# Patient Record
Sex: Male | Born: 1990 | Race: Black or African American | Hispanic: No | Marital: Single | Smoking: Former smoker
Health system: Southern US, Community
[De-identification: ages and names within clinical notes are randomized; demographics above are authoritative.]

## PROBLEM LIST (undated history)

## (undated) DIAGNOSIS — J45909 Unspecified asthma, uncomplicated: Secondary | ICD-10-CM

## (undated) DIAGNOSIS — I10 Essential (primary) hypertension: Secondary | ICD-10-CM

## (undated) HISTORY — PX: TONSILLECTOMY: SUR1361

---
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
## 2011-02-07 ENCOUNTER — Emergency Department (HOSPITAL_COMMUNITY): Payer: Self-pay

## 2011-02-07 ENCOUNTER — Emergency Department (HOSPITAL_COMMUNITY)
Admission: EM | Admit: 2011-02-07 | Discharge: 2011-02-08 | Disposition: A | Discharge status: Home / Self Care | Payer: Self-pay | Attending: Emergency Medicine | Admitting: Emergency Medicine

## 2011-02-07 DIAGNOSIS — M25569 Pain in unspecified knee: Secondary | ICD-10-CM | POA: Insufficient documentation

## 2013-06-08 ENCOUNTER — Emergency Department (HOSPITAL_COMMUNITY)
Admission: EM | Admit: 2013-06-08 | Discharge: 2013-06-08 | Disposition: A | Discharge status: Home / Self Care | Payer: Self-pay | Attending: Emergency Medicine | Admitting: Emergency Medicine

## 2013-06-08 ENCOUNTER — Encounter (HOSPITAL_COMMUNITY): Payer: Self-pay | Admitting: Emergency Medicine

## 2013-06-08 DIAGNOSIS — R300 Dysuria: Secondary | ICD-10-CM | POA: Insufficient documentation

## 2013-06-08 DIAGNOSIS — R369 Urethral discharge, unspecified: Secondary | ICD-10-CM | POA: Insufficient documentation

## 2013-06-08 DIAGNOSIS — F17200 Nicotine dependence, unspecified, uncomplicated: Secondary | ICD-10-CM | POA: Insufficient documentation

## 2013-06-08 LAB — URINALYSIS, ROUTINE W REFLEX MICROSCOPIC
Bilirubin Urine: NEGATIVE
Specific Gravity, Urine: 1.014 (ref 1.005–1.030)
pH: 7 (ref 5.0–8.0)

## 2013-06-08 LAB — URINE MICROSCOPIC-ADD ON

## 2013-06-08 MED ORDER — ONDANSETRON 4 MG PO TBDP
4.0000 mg | ORAL_TABLET | Freq: Once | ORAL | Status: AC
  Administered 2013-06-08: 4 mg via ORAL
  Filled 2013-06-08: qty 1

## 2013-06-08 MED ORDER — AZITHROMYCIN 1 G PO PACK
1.0000 g | PACK | Freq: Once | ORAL | Status: AC
  Administered 2013-06-08: 1 g via ORAL
  Filled 2013-06-08: qty 1

## 2013-06-08 MED ORDER — CEFTRIAXONE SODIUM 250 MG IJ SOLR
250.0000 mg | Freq: Once | INTRAMUSCULAR | Status: AC
  Administered 2013-06-08: 250 mg via INTRAMUSCULAR
  Filled 2013-06-08: qty 250

## 2013-06-08 NOTE — Progress Notes (Signed)
[facility] provided patient with a [facility] Orange Card application.  °

## 2013-06-08 NOTE — ED Notes (Signed)
Pt left prior to discharge paperwork was given, PA Abby aware.

## 2013-06-08 NOTE — ED Provider Notes (Signed)
CSN: [id number]     Arrival date & time 06/08/13  7829 History   First MD Initiated Contact with Patient 06/08/13 1109     Chief Complaint  Patient presents with  . Penile Discharge   (Consider location/radiation/quality/duration/timing/severity/associated sxs/prior Treatment) HPI 22 year old male presents emergency department with 2 days of penile discharge and dysuria.  Patient admits  to unprotected sexual intercourse with innumerable multiple sexual partners.  He is unable to recall any partners with similar symptoms.  The patient denies any penile lesions, testicular pain, follow her up when necessary.  The patient denies any lesions around the anus.  He denies fevers, chills, myalgias, arthralgias, abdominal pain, nausea, vomiting, chest pain, shortness of breath, difficulty swallowing, neck stiffness, rashes or headache.  History reviewed. No pertinent past medical history. History reviewed. No pertinent past surgical history. No family history on file. History  Substance Use Topics  . Smoking status: Current Every Day Smoker -- 1.00 packs/day    Types: Cigarettes  . Smokeless tobacco: Not on file  . Alcohol Use: No    Review of Systems As stated in history of present illness Allergies  Review of patient's allergies indicates no known allergies.  Home Medications   Current Outpatient Rx  Name  Route  Sig  Dispense  Refill  . ibuprofen (ADVIL,MOTRIN) 200 MG tablet   Oral   Take 400 mg by mouth every 6 (six) hours as needed for pain (headache).          BP 138/104  Pulse 69  Temp(Src) 99.5 F (37.5 C) (Oral)  Resp 18  SpO2 100% Physical Exam  Nursing note and vitals reviewed. Constitutional: He appears well-developed and well-nourished. No distress.  HENT:  Head: Normocephalic and atraumatic.  Eyes: Conjunctivae are normal. No scleral icterus.  Neck: Normal range of motion. Neck supple.  Cardiovascular: Normal rate, regular rhythm and normal heart sounds.    Pulmonary/Chest: Effort normal and breath sounds normal. No respiratory distress.  Abdominal: Soft. There is no tenderness.  Genitourinary:  Normal circumcised penis.  No testicular pain.  Cremasteric reflex is present.  No inguinal lymphadenopathy.  No lesions noted.  Patient has been the discharge from the meatus of the penis.  GC Chlamydia probe obtained  Musculoskeletal: He exhibits no edema.  Neurological: He is alert.  Skin: Skin is warm and dry. He is not diaphoretic.  Psychiatric: His behavior is normal.    ED Course  Procedures (including critical care time) Labs Review Labs Reviewed  GC/CHLAMYDIA PROBE AMP  URINALYSIS, ROUTINE W REFLEX MICROSCOPIC   Imaging Review No results found.  MDM   1. Penile discharge   2. Dysuria    Patient with symptoms consistent with gonorrhea or chlamydial infection.  He has been treated here with azithromycin and Rocephin.  I discussed need to protect himself when having sexual intercourse and need for further testing at the health Department as well as contacting his sexual partners about his STD. The patient is here with his aunt he needs to be.  He did not wish to wait for urinalysis.  I have allowed the patient to leave before the results have resulted.  Patient has given me his contact and fell and will contact the patient if there is abnormal result.The patient appears reasonably screened and/or stabilized for discharge and I doubt any other medical condition or other [facility] requiring further screening, evaluation, or treatment in the ED at this time prior to discharge.     Arthor Captain, PA-C 06/08/13  1212 

## 2013-06-08 NOTE — ED Notes (Signed)
Pt states that he has been having penile discharge x 2 days that is "tan" in color.  States that he has a "bunch" of sexual partners and didn't use protection with a couple.

## 2013-06-08 NOTE — Discharge Instructions (Signed)
You have been treated in the emergency department for an infection, possibly sexually transmitted. Results of your gonorrhea and chlamydia tests are pending and you will be notified if they are positive. It is very important to practice safe sex and use condoms when sexually active. If your results are positive you need to notify all sexual partners so they can be treated as well. The website http://www.dontspreadit.com/ can be used to send anonymous text messages or emails to alert sexual contacts. Follow up with your doctor, or OBGYN in regards to today's visit.   ° °Gonorrhea and Chlamydia °SYMPTOMS  °In females, symptoms may go unnoticed. Symptoms that are more noticeable can include:  °Belly (abdominal) pain.  °Painful intercourse.  °Watery mucous-like discharge from the vagina.  °Miscarriage.  °Discomfort when urinating.  °Inflammation of the rectum.  °Abnormal gray-green frothy vaginal discharge  °Vaginal itching and irritatio  °Itching and irritation of the area outside the vagina.   °Painful urination.  °Bleeding after sexual intercourse.  °In males, symptoms include:  °Burning with urination.  °Pain in the testicles.  °Watery mucous-like discharge from the penis.  °It can cause longstanding (chronic) pelvic pain after frequent infections.  °TREATMENT  °PID can cause women to not be able to have children (sterile) if left untreated or if half-treated.  It is important to finish ALL medications given to you.  °This is a sexually transmitted infection. So you are also at risk for other sexually transmitted diseases, including HIV (AIDS), it is recommended that you get tested. °HOME CARE INSTRUCTIONS  °Warning: This infection is contagious. Do not have sex until treatment is completed. Follow up at your caregiver's office or the clinic to which you were referred. If your diagnosis (learning what is wrong) is confirmed by culture or some other method, your recent sexual contacts need treatment. Even if they are  symptom free or have a negative culture or evaluation, they should be treated.  °PREVENTION  °Women should use sanitary pads instead of tampons for vaginal discharge.  °Wipe front to back after using the toilet and avoid douching.   °Practice safe sex, use condoms, have only one sex partner and be sure your sex partner is not having sex with others.  °Ask your caregiver to test you for chlamydia at your regular checkups or sooner if you are having symptoms.  °Ask for further information if you are pregnant.  °SEEK IMMEDIATE MEDICAL CARE IF:  °You develop an oral temperature above 102° F (38.9° C), not controlled by medications or lasting more than 2 days.  °You develop an increase in pain.  °You develop any type of abnormal discharge.  °You develop vaginal bleeding and it is not time for your period.  °You develop painful intercourse.  ° °Bacterial Vaginosis  °Bacterial vaginosis (BV) is a vaginal infection where the normal balance of bacteria in the vagina is disrupted. This is not a sexually transmitted disease and your sexual partners do NOT need to be treated. °CAUSES  °The cause of BV is not fully understood. BV develops when there is an increase or imbalance of harmful bacteria.  °Some activities or behaviors can upset the normal balance of bacteria in the vagina and put women at increased risk including:  °Having a new sex partner or multiple sex partners.  °Douching.  °Using an intrauterine device (IUD) for contraception.  °It is not clear what role sexual activity plays in the development of BV. However, women that have never had sexual intercourse are rarely   infected with BV.  °Women do not get BV from toilet seats, bedding, swimming pools or from touching objects around them.  ° °SYMPTOMS  °Grey vaginal discharge.  °A fish-like odor with discharge, especially after sexual intercourse.  °Itching or burning of the vagina and vulva.  °Burning or pain with urination.  °Some women have no signs or symptoms at  all.  ° °TREATMENT  °Sometimes BV will clear up without treatment.  °BV may be treated with antibiotics.  °BV can recur after treatment. If this happens, a second round of antibiotics will often be prescribed.  °HOME CARE INSTRUCTIONS  °Finish all medication as directed by your caregiver.  °Do not have sex until treatment is completed.  °Do NOT drink any alcoholic beverages while being treated  with Metronidazole (Flagyl). This will cause a severe reaction inducing vomiting. ° °RESOURCE GUIDE ° °Dental Problems ° °Patients with Medicaid: °[facility]                     [facility] °[address]                                           [address] °Phone:  [phone]                                                  Phone:  510-2600 ° °If unable to pay or uninsured, contact:  [facility] or [facility] to become qualified for the adult dental clinic. ° °Chronic Pain Problems °Contact [facility]  297-2271 °Patients need to be referred by their primary care doctor. ° °Insufficient Money for Medicine °Contact [employer]:  call "211" or [facility] 271-5999. ° °No Primary Care Doctor °Call [facility]  832-8000 °Other agencies that provide inexpensive medical care °   [facility]  832-8035 °   [facility]  832-7272 °   [facility]  271-5999 °   [facility]  832-4777 °   [facility]  373-0678 °   [facility]  272-1050 ° °Psychological Services °[facility]  [phone] °[facility]  378-7881 °[facility]   800 853-5163 (emergency services 641-4993) ° °Substance Abuse Resources °[facility]  336-882-2125 °[facility] 336-784-9470 °[facility] 336-285-9073 °[facility] 336-845-3988 °Residential & Outpatient Substance Abuse Program  800-659-3381 ° °Abuse/Neglect °[place] [facility] (336)  641-3795 °[place] [facility] Hotline 800-378-5315 (After Hours) ° °Emergency Shelter °[facility] (336) 271-5985 ° °Maternity Homes °[facility] (336) 275-9566 °[facility] (704) 372-4663 ° °MRSA Hotline #:   832-7006 ° ° ° °[place] [facility] ° °[facility] [place] [facility]     [employer]                          [facility] °[address] [place]                       [address]      [address]  °  [place]                                                [place]                            [place] °Phone:  349-3220                                   Phone:  [phone]                 Phone:  342-8140 ° °[facility] °Phone:  342-8316 ° °[place] [facility] Hotline °(336) 342-1394 °(336) 342-3537 (After Hours) ° °

## 2013-06-09 LAB — URINE CULTURE: Colony Count: NO GROWTH

## 2013-06-09 LAB — GC/CHLAMYDIA PROBE AMP: GC Probe RNA: POSITIVE — AB

## 2013-06-09 NOTE — ED Provider Notes (Signed)
Medical screening examination/treatment/procedure(s) were performed by non-physician practitioner and as supervising physician I was immediately available for consultation/collaboration.   Shanna Cisco, MD 06/09/13 [phone]

## 2013-06-10 ENCOUNTER — Telehealth (HOSPITAL_COMMUNITY): Payer: Self-pay | Admitting: *Deleted

## 2013-06-10 NOTE — ED Notes (Signed)
+   Gonorrhea + Chlamydia  Patient treated with Rocephin And Zithromax-[facility] faxed 

## 2013-06-12 ENCOUNTER — Telehealth (HOSPITAL_COMMUNITY): Payer: Self-pay | Admitting: Emergency Medicine

## 2013-06-14 NOTE — ED Notes (Signed)
Unable to contact via phone - letter sent °

## 2013-08-20 ENCOUNTER — Encounter (HOSPITAL_COMMUNITY): Payer: Self-pay | Admitting: Emergency Medicine

## 2013-08-20 ENCOUNTER — Emergency Department (HOSPITAL_COMMUNITY)
Admission: EM | Admit: 2013-08-20 | Discharge: 2013-08-21 | Disposition: A | Discharge status: Home / Self Care | Payer: Self-pay | Attending: Emergency Medicine | Admitting: Emergency Medicine

## 2013-08-20 ENCOUNTER — Emergency Department (HOSPITAL_COMMUNITY): Payer: Self-pay

## 2013-08-20 DIAGNOSIS — Y9302 Activity, running: Secondary | ICD-10-CM | POA: Insufficient documentation

## 2013-08-20 DIAGNOSIS — Z79899 Other long term (current) drug therapy: Secondary | ICD-10-CM | POA: Insufficient documentation

## 2013-08-20 DIAGNOSIS — R296 Repeated falls: Secondary | ICD-10-CM | POA: Insufficient documentation

## 2013-08-20 DIAGNOSIS — Y929 Unspecified place or not applicable: Secondary | ICD-10-CM | POA: Insufficient documentation

## 2013-08-20 DIAGNOSIS — S8001XA Contusion of right knee, initial encounter: Secondary | ICD-10-CM

## 2013-08-20 DIAGNOSIS — S8000XA Contusion of unspecified knee, initial encounter: Secondary | ICD-10-CM | POA: Insufficient documentation

## 2013-08-20 DIAGNOSIS — F17200 Nicotine dependence, unspecified, uncomplicated: Secondary | ICD-10-CM | POA: Insufficient documentation

## 2013-08-20 MED ORDER — IBUPROFEN 200 MG PO TABS
600.0000 mg | ORAL_TABLET | Freq: Once | ORAL | Status: AC
  Administered 2013-08-20: 600 mg via ORAL
  Filled 2013-08-20: qty 3

## 2013-08-20 MED ORDER — IBUPROFEN 600 MG PO TABS: 600.0000 mg | ORAL_TABLET | Freq: Three times a day (TID) | ORAL | Status: DC

## 2013-08-20 NOTE — ED Notes (Signed)
Pt arrived to the ED with a complaint of knee pain.  Pt was running and fell hurting his right knee.

## 2013-08-20 NOTE — ED Provider Notes (Signed)
CSN: [id number]     Arrival date & time 08/20/13  2144 History   None  This chart was scribed for Earley Favor NP, a non-physician practitioner working with Gavin Pound. Oletta Lamas, MD by Lewanda Rife, ED Scribe. This patient was seen in room WTR6/WTR6 and the patient's care was started at 11:44 PM     Chief Complaint  Patient presents with  . Knee Pain   (Consider location/radiation/quality/duration/timing/severity/associated sxs/prior Treatment) The history is provided by the patient. No language interpreter was used.   HPI Comments: Eric Valencia is a 22 y.o. male who presents to the Emergency Department complaining of constant moderate right knee pain onset PTA since falling onto his right knee while running away from the police. Reports pain is exacerbated by touch and weight bearing. Denies any alleviating factors. Denies associated head injury, neck pain, LOC, and other injuries.   History reviewed. No pertinent past medical history. History reviewed. No pertinent past surgical history. History reviewed. No pertinent family history. History  Substance Use Topics  . Smoking status: Current Every Day Smoker -- 1.00 packs/day    Types: Cigarettes  . Smokeless tobacco: Not on file  . Alcohol Use: No    Review of Systems  Musculoskeletal: Positive for arthralgias.  All other systems reviewed and are negative.   A complete 10 system review of systems was obtained and all systems are negative except as noted in the HPI and PMHx.    Allergies  Review of patient's allergies indicates no known allergies.  Home Medications   Current Outpatient Rx  Name  Route  Sig  Dispense  Refill  . albuterol (PROVENTIL HFA;VENTOLIN HFA) 108 (90 BASE) MCG/ACT inhaler   Inhalation   Inhale 1 puff into the lungs every 6 (six) hours as needed for wheezing or shortness of breath.         Marland Kitchen ibuprofen (ADVIL,MOTRIN) 600 MG tablet   Oral   Take 1 tablet (600 mg total) by mouth 3 (three)  times daily.   30 tablet   0    BP 105/63  Pulse 107  Temp(Src) 98.2 F (36.8 C) (Oral)  Resp 20  Ht 5\' 6"  (1.676 m)  Wt 168 lb (76.204 kg)  BMI 27.13 kg/m2  SpO2 98% Physical Exam  Nursing note and vitals reviewed. Constitutional: He is oriented to person, place, and time. He appears well-developed and well-nourished. No distress.  HENT:  Head: Normocephalic and atraumatic.  Eyes: EOM are normal.  Neck: Neck supple. No tracheal deviation present.  Cardiovascular: Normal rate.   Pulmonary/Chest: Effort normal. No respiratory distress.  Musculoskeletal: Normal range of motion.       Right knee: He exhibits no swelling. Tenderness found.  Pt gestures to distal patellar tendon. Soft tissue swelling laterally.    Neurological: He is alert and oriented to person, place, and time.  Skin: Skin is warm and dry.  Psychiatric: He has a normal mood and affect. His behavior is normal.    ED Course  Procedures  COORDINATION OF CARE:  Nursing notes reviewed. Vital signs reviewed. Initial pt interview and examination performed.   11:44 PM-Discussed work up plan with pt at bedside, which includes x-ray of right knee. Pt agrees with plan.   Treatment plan initiated: Medications  ibuprofen (ADVIL,MOTRIN) tablet 600 mg (600 mg Oral Given 08/20/13 2312)     Initial diagnostic testing ordered.    Labs Review Labs Reviewed - No data to display Imaging Review Dg Knee Complete 4 Views  Right  08/20/2013   CLINICAL DATA:  Fall. Pain along the anterior aspect a right knee.  EXAM: RIGHT KNEE - COMPLETE 4+ VIEW  COMPARISON:  None.  FINDINGS: There is no evidence of fracture, dislocation, or joint effusion. There is no evidence of arthropathy or other focal bone abnormality. Soft tissues are unremarkable.  IMPRESSION: Negative.   Electronically Signed   By: Signa Kell M.D.   On: 08/20/2013 23:21    EKG Interpretation   None       MDM   1. Knee contusion, right, initial encounter      Xray reviewed no fx  placed in knee sleeve for comfort and additional support  I personally performed the services described in this documentation, which was scribed in my presence. The recorded information has been reviewed and is accurate.   Arman Filter, NP 08/20/13 [phone]

## 2013-08-20 NOTE — ED Notes (Signed)
Pt ambulatory to exam room with steady gait.  

## 2013-08-20 NOTE — ED Notes (Signed)
Patient transported to X-ray 

## 2013-08-21 NOTE — ED Provider Notes (Signed)
Medical screening examination/treatment/procedure(s) were performed by non-physician practitioner and as supervising physician I was immediately available for consultation/collaboration.  EKG Interpretation   None         Gavin Pound. Oletta Lamas, MD 08/21/13 1610

## 2013-09-26 ENCOUNTER — Ambulatory Visit (HOSPITAL_COMMUNITY): Payer: Self-pay | Attending: Emergency Medicine

## 2013-09-26 ENCOUNTER — Emergency Department (HOSPITAL_COMMUNITY)
Admission: EM | Admit: 2013-09-26 | Discharge: 2013-09-26 | Disposition: A | Discharge status: Home / Self Care | Payer: Self-pay | Attending: Emergency Medicine | Admitting: Emergency Medicine

## 2013-09-26 ENCOUNTER — Encounter (HOSPITAL_COMMUNITY): Payer: Self-pay | Admitting: Emergency Medicine

## 2013-09-26 DIAGNOSIS — R109 Unspecified abdominal pain: Secondary | ICD-10-CM

## 2013-09-26 DIAGNOSIS — F17200 Nicotine dependence, unspecified, uncomplicated: Secondary | ICD-10-CM | POA: Insufficient documentation

## 2013-09-26 DIAGNOSIS — M545 Low back pain, unspecified: Secondary | ICD-10-CM | POA: Insufficient documentation

## 2013-09-26 DIAGNOSIS — Z79899 Other long term (current) drug therapy: Secondary | ICD-10-CM | POA: Insufficient documentation

## 2013-09-26 DIAGNOSIS — R1013 Epigastric pain: Secondary | ICD-10-CM | POA: Insufficient documentation

## 2013-09-26 LAB — CBC WITH DIFFERENTIAL/PLATELET
Eosinophils Absolute: 0.1 10*3/uL (ref 0.0–0.7)
Eosinophils Relative: 1 % (ref 0–5)
Hemoglobin: 14.7 g/dL (ref 13.0–17.0)
Lymphocytes Relative: 19 % (ref 12–46)
Lymphs Abs: 1.5 10*3/uL (ref 0.7–4.0)
MCH: 29.9 pg (ref 26.0–34.0)
MCV: 88.8 fL (ref 78.0–100.0)
Monocytes Relative: 8 % (ref 3–12)
Neutrophils Relative %: 72 % (ref 43–77)
RBC: 4.91 MIL/uL (ref 4.22–5.81)

## 2013-09-26 LAB — COMPREHENSIVE METABOLIC PANEL
Alkaline Phosphatase: 62 U/L (ref 39–117)
BUN: 11 mg/dL (ref 6–23)
CO2: 28 mEq/L (ref 19–32)
GFR calc Af Amer: >90 mL/min (ref >90)
GFR calc non Af Amer: >90 mL/min (ref >90)
Glucose, Bld: 70 mg/dL (ref 70–99)
Potassium: 4.5 mEq/L (ref 3.5–5.1)
Total Bilirubin: 0.4 mg/dL (ref 0.3–1.2)
Total Protein: 7 g/dL (ref 6.0–8.3)

## 2013-09-26 LAB — URINALYSIS, ROUTINE W REFLEX MICROSCOPIC
Bilirubin Urine: NEGATIVE
Specific Gravity, Urine: 1.005 (ref 1.005–1.030)
pH: 6 (ref 5.0–8.0)

## 2013-09-26 LAB — URINE MICROSCOPIC-ADD ON

## 2013-09-26 MED ORDER — IOHEXOL 300 MG/ML  SOLN
100.0000 mL | Freq: Once | INTRAMUSCULAR | Status: AC | PRN
  Administered 2013-09-26: 100 mL via INTRAVENOUS

## 2013-09-26 MED ORDER — MORPHINE SULFATE 4 MG/ML IJ SOLN
4.0000 mg | Freq: Once | INTRAMUSCULAR | Status: AC
  Administered 2013-09-26: 4 mg via INTRAVENOUS
  Filled 2013-09-26: qty 1

## 2013-09-26 MED ORDER — SODIUM CHLORIDE 0.9 % IV SOLN
1000.0000 mL | INTRAVENOUS | Status: DC
  Administered 2013-09-26: 1000 mL via INTRAVENOUS

## 2013-09-26 MED ORDER — ONDANSETRON HCL 4 MG/2ML IJ SOLN
4.0000 mg | Freq: Once | INTRAMUSCULAR | Status: AC
  Administered 2013-09-26: 4 mg via INTRAVENOUS
  Filled 2013-09-26: qty 2

## 2013-09-26 MED ORDER — IOHEXOL 300 MG/ML  SOLN
50.0000 mL | Freq: Once | INTRAMUSCULAR | Status: AC | PRN
  Administered 2013-09-26: 50 mL via ORAL

## 2013-09-26 MED ORDER — IBUPROFEN 800 MG PO TABS: 800.0000 mg | ORAL_TABLET | Freq: Three times a day (TID) | ORAL | Status: DC

## 2013-09-26 MED ORDER — SODIUM CHLORIDE 0.9 % IV SOLN
1000.0000 mL | Freq: Once | INTRAVENOUS | Status: AC
  Administered 2013-09-26: 1000 mL via INTRAVENOUS

## 2013-09-26 NOTE — ED Notes (Signed)
Pt c/o upper abdominal pain and lower back pain x "2 hrs."  Pain score 9/10.  Denies n/v/d and GU symptoms.

## 2013-09-26 NOTE — Progress Notes (Signed)
[facility] provided pt with a list of primary care resources and [facility] information.  °

## 2013-09-26 NOTE — ED Provider Notes (Signed)
CSN: [id number]     Arrival date & time 09/26/13  1206 History   First MD Initiated Contact with Patient 09/26/13 1224     Chief Complaint  Patient presents with  . Abdominal Pain  . Back Pain   (Consider location/radiation/quality/duration/timing/severity/associated sxs/prior Treatment) HPI Patient reports about 3 hours ago he was awake and laying in bed and started getting a sharp pain that he describes as being in his epigastric area that radiates into his back and then goes up his upper back between his shoulder blades. He states the pain is constant. He states deep breathing and walking makes the pain worse. He states he has to bend over to walk. He denies nausea, vomiting, diarrhea, or cough. He does not feel short of breath but states it hurts to breathe. He states he last ate last night. He has not been around anybody else is ill. He denies having this before. He states the only new medication he took was in ibuprofen last night for a toothache.  PCP none  History reviewed. No pertinent past medical history.  RAD  History reviewed. No pertinent past surgical history. History reviewed. No pertinent family history. History  Substance Use Topics  . Smoking status: Current Every Day Smoker -- 1.00 packs/day    Types: Cigarettes  . Smokeless tobacco: Never Used  . Alcohol Use: Yes  employed  Review of Systems  All other systems reviewed and are negative.    Allergies  Review of patient's allergies indicates no known allergies.  Home Medications   Current Outpatient Rx  Name  Route  Sig  Dispense  Refill  . albuterol (PROVENTIL HFA;VENTOLIN HFA) 108 (90 BASE) MCG/ACT inhaler   Inhalation   Inhale 1 puff into the lungs every 6 (six) hours as needed for wheezing or shortness of breath.         Marland Kitchen ibuprofen (ADVIL,MOTRIN) 600 MG tablet   Oral   Take 1 tablet (600 mg total) by mouth 3 (three) times daily.   30 tablet   0    BP 150/87  Pulse 89  Temp(Src) 97.9 F  (36.6 C) (Oral)  Resp 16  SpO2 99%  Vital signs normal   Physical Exam  Nursing note and vitals reviewed. Constitutional: He is oriented to person, place, and time. He appears well-developed and well-nourished.  Non-toxic appearance. He does not appear ill. No distress.  HENT:  Head: Normocephalic and atraumatic.  Right Ear: External ear normal.  Left Ear: External ear normal.  Nose: Nose normal. No mucosal edema or rhinorrhea.  Mouth/Throat: Oropharynx is clear and moist and mucous membranes are normal. No dental abscesses or uvula swelling.  Eyes: Conjunctivae and EOM are normal. Pupils are equal, round, and reactive to light.  Neck: Normal range of motion and full passive range of motion without pain. Neck supple.  Cardiovascular: Normal rate, regular rhythm and normal heart sounds.  Exam reveals no gallop and no friction rub.   No murmur heard. Pulmonary/Chest: Effort normal and breath sounds normal. No respiratory distress. He has no wheezes. He has no rhonchi. He has no rales. He exhibits no tenderness and no crepitus.  Abdominal: Soft. Normal appearance and bowel sounds are normal. He exhibits no distension. There is no tenderness. There is no rebound and no guarding.    Although patient told me his pain was in his upper abdomen on exam he is most tender in the right lower corner with some mild tenderness in the right mid and  right upper abdomen. He has pain to percussion over the right lower quadrant. He has mild Rovsing sign. He has pain to knee tapping and he has positive psoas sign.  Musculoskeletal: Normal range of motion. He exhibits no edema and no tenderness.  Moves all extremities well.   Neurological: He is alert and oriented to person, place, and time. He has normal strength. No cranial nerve deficit.  Skin: Skin is warm, dry and intact. No rash noted. No erythema. No pallor.  Psychiatric: He has a normal mood and affect. His speech is normal and behavior is normal. His  mood appears not anxious.    ED Course  Procedures (including critical care time)  Medications  0.9 %  sodium chloride infusion (0 mLs Intravenous Stopped 09/26/13 1454)  morphine 4 MG/ML injection 4 mg (4 mg Intravenous Given 09/26/13 1342)  ondansetron (ZOFRAN) injection 4 mg (4 mg Intravenous Given 09/26/13 1342)  iohexol (OMNIPAQUE) 300 MG/ML solution 50 mL (50 mLs Oral Contrast Given 09/26/13 1335)  iohexol (OMNIPAQUE) 300 MG/ML solution 100 mL (100 mLs Intravenous Contrast Given 09/26/13 1539)  morphine 4 MG/ML injection 4 mg (4 mg Intravenous Given 09/26/13 1627)    Pt requesting more pain medication at 16:15  Pt left with Dr Gwendolyn Grant at 16:20 at change of shift to reexamine and get UA results.    Labs Review Results for orders placed during the hospital encounter of 09/26/13  URINALYSIS, ROUTINE W REFLEX MICROSCOPIC      Result Value Range   Color, Urine YELLOW  YELLOW   APPearance CLOUDY (*) CLEAR   Specific Gravity, Urine 1.005  1.005 - 1.030   pH 6.0  5.0 - 8.0   Glucose, UA NEGATIVE  NEGATIVE mg/dL   Hgb urine dipstick TRACE (*) NEGATIVE   Bilirubin Urine NEGATIVE  NEGATIVE   Ketones, ur NEGATIVE  NEGATIVE mg/dL   Protein, ur NEGATIVE  NEGATIVE mg/dL   Urobilinogen, UA 0.2  0.0 - 1.0 mg/dL   Nitrite NEGATIVE  NEGATIVE   Leukocytes, UA NEGATIVE  NEGATIVE  CBC WITH DIFFERENTIAL      Result Value Range   WBC 7.8  4.0 - 10.5 K/uL   RBC 4.91  4.22 - 5.81 MIL/uL   Hemoglobin 14.7  13.0 - 17.0 g/dL   HCT 09.8  11.9 - 14.7 %   MCV 88.8  78.0 - 100.0 fL   MCH 29.9  26.0 - 34.0 pg   MCHC 33.7  30.0 - 36.0 g/dL   RDW 82.9  56.2 - 13.0 %   Platelets 259  150 - 400 K/uL   Neutrophils Relative % 72  43 - 77 %   Neutro Abs 5.6  1.7 - 7.7 K/uL   Lymphocytes Relative 19  12 - 46 %   Lymphs Abs 1.5  0.7 - 4.0 K/uL   Monocytes Relative 8  3 - 12 %   Monocytes Absolute 0.6  0.1 - 1.0 K/uL   Eosinophils Relative 1  0 - 5 %   Eosinophils Absolute 0.1  0.0 - 0.7 K/uL    Basophils Relative 0  0 - 1 %   Basophils Absolute 0.0  0.0 - 0.1 K/uL  COMPREHENSIVE METABOLIC PANEL      Result Value Range   Sodium 140  135 - 145 mEq/L   Potassium 4.5  3.5 - 5.1 mEq/L   Chloride 102  96 - 112 mEq/L   CO2 28  19 - 32 mEq/L   Glucose, Bld 70  70 - 99 mg/dL   BUN 11  6 - 23 mg/dL   Creatinine, Ser 1.61  0.50 - 1.35 mg/dL   Calcium 9.7  8.4 - 09.6 mg/dL   Total Protein 7.0  6.0 - 8.3 g/dL   Albumin 4.1  3.5 - 5.2 g/dL   AST 19  0 - 37 U/L   ALT 12  0 - 53 U/L   Alkaline Phosphatase 62  39 - 117 U/L   Total Bilirubin 0.4  0.3 - 1.2 mg/dL   GFR calc non Af Amer >90  >90 mL/min   GFR calc Af Amer >90  >90 mL/min  URINE MICROSCOPIC-ADD ON      Result Value Range   WBC, UA 0-2  <3 WBC/hpf   RBC / HPF 0-2  <3 RBC/hpf   Laboratory interpretation all normal   Imaging Review Ct Abdomen Pelvis W Contrast  09/26/2013   CLINICAL DATA:  Abdominal pain  EXAM: CT ABDOMEN AND PELVIS WITH CONTRAST  TECHNIQUE: Multidetector CT imaging of the abdomen and pelvis was performed using the standard protocol following bolus administration of intravenous contrast. Oral contrast was also administered.  CONTRAST:  OMNIPAQUE IOHEXOL 300 MG/ML  SOLN  COMPARISON:  None.  FINDINGS: Lung bases are clear.  No focal liver lesions are identified. There is no biliary duct dilatation. Gallbladder wall does not appear thickened.  Spleen, pancreas, and adrenals appear normal. Kidneys bilaterally show no mass or hydronephrosis. There is no renal or ureteral calculus on either side.  In the pelvis, the urinary bladder is midline with normal wall thickness. There is no pelvic mass or fluid collection. Appendix appears normal.  There is no bowel obstruction.  No free air or portal venous air.  There is no ascites, adenopathy, or abscess in the abdomen or pelvis. Aorta is nonaneurysmal. There are no blastic or lytic bone lesions.  IMPRESSION: Appendix appears normal. There is no mesenteric inflammation or  abscess. No bowel obstruction. No hydronephrosis.   Electronically Signed   By: Bretta Bang M.D.   On: 09/26/2013 15:57    EKG Interpretation   None       MDM   1. Abdominal pain     Disposition pending  Devoria Albe, MD, Armando Gang    Ward Givens, MD 09/27/13 [phone]

## 2013-09-26 NOTE — ED Provider Notes (Signed)
1600 - Care from Dr. Lynelle Doctor. CT normal for abdominal pain. Redosed pain meds, awaiting urine. Urine negative, repeat exam benign. Patient talking on phone during my re-exam. Given motrin.   1. Abdominal pain      Dagmar Hait, MD 09/26/13 1745

## 2013-10-07 ENCOUNTER — Emergency Department (HOSPITAL_COMMUNITY)
Admission: EM | Admit: 2013-10-07 | Discharge: 2013-10-07 | Disposition: A | Discharge status: Home / Self Care | Payer: Self-pay | Attending: Emergency Medicine | Admitting: Emergency Medicine

## 2013-10-07 ENCOUNTER — Encounter (HOSPITAL_COMMUNITY): Payer: Self-pay | Admitting: Emergency Medicine

## 2013-10-07 DIAGNOSIS — K089 Disorder of teeth and supporting structures, unspecified: Secondary | ICD-10-CM | POA: Insufficient documentation

## 2013-10-07 DIAGNOSIS — F17200 Nicotine dependence, unspecified, uncomplicated: Secondary | ICD-10-CM | POA: Insufficient documentation

## 2013-10-07 DIAGNOSIS — K029 Dental caries, unspecified: Secondary | ICD-10-CM | POA: Insufficient documentation

## 2013-10-07 DIAGNOSIS — Z79899 Other long term (current) drug therapy: Secondary | ICD-10-CM | POA: Insufficient documentation

## 2013-10-07 DIAGNOSIS — Z791 Long term (current) use of non-steroidal anti-inflammatories (NSAID): Secondary | ICD-10-CM | POA: Insufficient documentation

## 2013-10-07 MED ORDER — TRAMADOL HCL 50 MG PO TABS: 50.0000 mg | ORAL_TABLET | Freq: Four times a day (QID) | ORAL | Status: DC | PRN

## 2013-10-07 MED ORDER — TRAMADOL HCL 50 MG PO TABS
50.0000 mg | ORAL_TABLET | Freq: Once | ORAL | Status: AC
  Administered 2013-10-07: 50 mg via ORAL
  Filled 2013-10-07: qty 1

## 2013-10-07 MED ORDER — PENICILLIN V POTASSIUM 500 MG PO TABS: 500.0000 mg | ORAL_TABLET | Freq: Three times a day (TID) | ORAL | Status: DC

## 2013-10-07 NOTE — ED Provider Notes (Signed)
CSN: [id number]     Arrival date & time 10/07/13  1738 History  This chart was scribed for non-physician practitioner, Fayrene Helper, PA-C,working with Junius Argyle, MD, by Karle Plumber, ED Scribe.  This patient was seen in room WTR6/WTR6 and the patient's care was started at 8:00 PM.  Chief Complaint  Patient presents with  . Dental Pain   The history is provided by the patient. No language interpreter was used.   HPI Comments:  Eric Valencia is a 22 y.o. male who presents to the Emergency Department complaining of ongoing intermittent bilateral dental pain for approximately one month. The pt states the pain has been worse today. Pt states that the upper right back teeth have holes in them. Pt states the pain is aching and a 10/10. He reports eating and talking make the pain worse. He reports taking one Ibuprofen 800mg  with no relief. He denies fever, chills, and abnormal taste in the mouth.  History reviewed. No pertinent past medical history. History reviewed. No pertinent past surgical history. History reviewed. No pertinent family history. History  Substance Use Topics  . Smoking status: Current Every Day Smoker -- 1.00 packs/day    Types: Cigarettes  . Smokeless tobacco: Never Used  . Alcohol Use: Yes    Review of Systems  Constitutional: Negative for fever and chills.  HENT: Positive for dental problem. Negative for facial swelling.     Allergies  Review of patient's allergies indicates no known allergies.  Home Medications   Current Outpatient Rx  Name  Route  Sig  Dispense  Refill  . albuterol (PROVENTIL HFA;VENTOLIN HFA) 108 (90 BASE) MCG/ACT inhaler   Inhalation   Inhale 1 puff into the lungs every 6 (six) hours as needed for wheezing or shortness of breath.         Marland Kitchen ibuprofen (ADVIL,MOTRIN) 800 MG tablet   Oral   Take 1 tablet (800 mg total) by mouth 3 (three) times daily.   21 tablet   0    Triage Vitals: BP 138/88  Pulse 64  Temp(Src) 98.3  F (36.8 C) (Oral)  Resp 14  SpO2 100% Physical Exam  Nursing note and vitals reviewed. Constitutional: He is oriented to person, place, and time. He appears well-developed and well-nourished.  HENT:  Head: Normocephalic and atraumatic.  Right Ear: External ear normal.  Left Ear: External ear normal.  Mouth/Throat: Oropharynx is clear and moist. Dental caries present. No dental abscesses. No oropharyngeal exudate.  Dental decay to second premolar on right upper jaw. Moderate size dental decay to 1st molar on left lower jaw. No gingival erythema. No obvious abscess.   No trismus  Eyes: EOM are normal.  Neck: Normal range of motion. Neck supple.  Cardiovascular: Normal rate.   Pulmonary/Chest: Effort normal.  Musculoskeletal: Normal range of motion.  Lymphadenopathy:    He has no cervical adenopathy.  Neurological: He is alert and oriented to person, place, and time.  Skin: Skin is warm and dry.  Psychiatric: He has a normal mood and affect. His behavior is normal.    ED Course  Procedures (including critical care time) DIAGNOSTIC STUDIES: Oxygen Saturation is 100% on RA, normal by my interpretation.   COORDINATION OF CARE: 8:05 PM- Will prescribe pain medication and give dental referral. Pt verbalizes understanding and agrees to plan.  Medications - No data to display  Labs Review Labs Reviewed - No data to display Imaging Review No results found.  EKG Interpretation   None  MDM   1. Pain due to dental caries    BP 138/88  Pulse 64  Temp(Src) 98.3 F (36.8 C) (Oral)  Resp 14  SpO2 100%   I personally performed the services described in this documentation, which was scribed in my presence. The recorded information has been reviewed and is accurate.     Fayrene Helper, PA-C 10/07/13 2013

## 2013-10-07 NOTE — ED Notes (Signed)
Patient in c/o toothache over the last few days

## 2013-10-08 NOTE — ED Provider Notes (Signed)
Medical screening examination/treatment/procedure(s) were performed by non-physician practitioner and as supervising physician I was immediately available for consultation/collaboration.  EKG Interpretation   None         Junius Argyle, MD 10/08/13 1304

## 2014-10-07 ENCOUNTER — Emergency Department (HOSPITAL_COMMUNITY)
Admission: EM | Admit: 2014-10-07 | Discharge: 2014-10-07 | Discharge status: Against Medical Advice | Payer: Self-pay | Attending: Emergency Medicine | Admitting: Emergency Medicine

## 2014-10-07 ENCOUNTER — Encounter (HOSPITAL_COMMUNITY): Payer: Self-pay

## 2014-10-07 ENCOUNTER — Emergency Department (HOSPITAL_COMMUNITY): Payer: Self-pay

## 2014-10-07 DIAGNOSIS — Y998 Other external cause status: Secondary | ICD-10-CM | POA: Insufficient documentation

## 2014-10-07 DIAGNOSIS — Z720 Tobacco use: Secondary | ICD-10-CM | POA: Insufficient documentation

## 2014-10-07 DIAGNOSIS — Z79899 Other long term (current) drug therapy: Secondary | ICD-10-CM | POA: Insufficient documentation

## 2014-10-07 DIAGNOSIS — W06XXXA Fall from bed, initial encounter: Secondary | ICD-10-CM | POA: Insufficient documentation

## 2014-10-07 DIAGNOSIS — Y9389 Activity, other specified: Secondary | ICD-10-CM | POA: Insufficient documentation

## 2014-10-07 DIAGNOSIS — W19XXXA Unspecified fall, initial encounter: Secondary | ICD-10-CM

## 2014-10-07 DIAGNOSIS — S3992XA Unspecified injury of lower back, initial encounter: Secondary | ICD-10-CM | POA: Insufficient documentation

## 2014-10-07 DIAGNOSIS — Y92003 Bedroom of unspecified non-institutional (private) residence as the place of occurrence of the external cause: Secondary | ICD-10-CM | POA: Insufficient documentation

## 2014-10-07 DIAGNOSIS — M545 Low back pain, unspecified: Secondary | ICD-10-CM

## 2014-10-07 MED ORDER — OXYCODONE-ACETAMINOPHEN 5-325 MG PO TABS: 1.0000 | ORAL_TABLET | Freq: Once | ORAL | Status: DC

## 2014-10-07 MED ORDER — DIAZEPAM 5 MG PO TABS: 5.0000 mg | ORAL_TABLET | Freq: Once | ORAL | Status: DC

## 2014-10-07 MED ORDER — IBUPROFEN 800 MG PO TABS: 800.0000 mg | ORAL_TABLET | Freq: Once | ORAL | Status: DC

## 2014-10-07 NOTE — ED Notes (Signed)
I am unable to locate pt. 

## 2014-10-07 NOTE — ED Notes (Signed)
Still unable to locate pt.

## 2014-10-07 NOTE — ED Notes (Signed)
He states he has low back pain.  He cites engaging in horse play ("wrestling") with his girlfriend; who is with him.  Pt. In no distress.  He c/o "my left leg is kinda numb".

## 2014-10-07 NOTE — ED Notes (Signed)
Still unable to locate pt.--will disposition AMA.

## 2014-10-07 NOTE — ED Provider Notes (Signed)
CSN: [id number]     Arrival date & time 10/07/14  [id number] History   First MD Initiated Contact with Patient 10/07/14 [phone]     Chief Complaint  Patient presents with  . Back Pain      HPI Patient reports awakening with a sore low back this morning.  He states that last night he fell onto his bottom out of the bed.  He states his been having pain in his back since this morning.  Has not tried any medications prior to arrival.  Denies weakness of his arms or legs.  No abdominal pain.  No chest pain shortness of breath.  Pain is mild to moderate in severity.  Pain is worse with movement and palpation of his low back.   History reviewed. No pertinent past medical history. No past surgical history on file. No family history on file. History  Substance Use Topics  . Smoking status: Current Every Day Smoker -- 1.00 packs/day    Types: Cigarettes  . Smokeless tobacco: Never Used  . Alcohol Use: Yes    Review of Systems  All other systems reviewed and are negative.     Allergies  Review of patient's allergies indicates no known allergies.  Home Medications   Prior to Admission medications   Medication Sig Start Date End Date Taking? Authorizing Provider  albuterol (PROVENTIL HFA;VENTOLIN HFA) 108 (90 BASE) MCG/ACT inhaler Inhale 1 puff into the lungs every 6 (six) hours as needed for wheezing or shortness of breath.   Yes Historical Provider, MD  ibuprofen (ADVIL,MOTRIN) 800 MG tablet Take 1 tablet (800 mg total) by mouth 3 (three) times daily. Patient not taking: Reported on 10/07/2014 09/26/13   Elwin MochaBlair Walden, MD  penicillin v potassium (VEETID) 500 MG tablet Take 1 tablet (500 mg total) by mouth 3 (three) times daily. Patient not taking: Reported on 10/07/2014 10/07/13   Fayrene HelperBowie Tran, PA-C  traMADol (ULTRAM) 50 MG tablet Take 1 tablet (50 mg total) by mouth every 6 (six) hours as needed. Patient not taking: Reported on 10/07/2014 10/07/13   Fayrene HelperBowie Tran, PA-C   BP 142/88 mmHg  Pulse  64  Temp(Src) 98.2 F (36.8 C) (Oral)  Resp 16  SpO2 99% Physical Exam  Constitutional: He is oriented to person, place, and time. He appears well-developed and well-nourished.  HENT:  Head: Normocephalic and atraumatic.  Eyes: EOM are normal.  Neck: Normal range of motion.  Cardiovascular: Normal rate, regular rhythm, normal heart sounds and intact distal pulses.   Pulmonary/Chest: Effort normal and breath sounds normal. No respiratory distress.  Abdominal: Soft. He exhibits no distension. There is no tenderness.  Musculoskeletal: Normal range of motion.  Mild lumbar and paralumbar tenderness without significant spasm.  5 out of 5 strength in bilateral lower extremity major muscle groups.  No thoracic tenderness  Neurological: He is alert and oriented to person, place, and time.  Skin: Skin is warm and dry.  Psychiatric: He has a normal mood and affect. Judgment normal.  Nursing note and vitals reviewed.   ED Course  Procedures (including critical care time) Labs Review Labs Reviewed - No data to display  Imaging Review Dg Lumbar Spine Complete  10/07/2014   CLINICAL DATA:  Fall out of bed 1 day ago directly onto lower back with resulting left low back pain radiating down left leg.  EXAM: LUMBAR SPINE - COMPLETE 4+ VIEW  COMPARISON:  None.  FINDINGS: There is no evidence of lumbar spine fracture. Alignment is normal. Intervertebral disc  spaces are maintained.  IMPRESSION: Negative.   Electronically Signed   By: Elberta Fortisaniel  Boyle M.D.   On: 10/07/2014 10:45  I personally reviewed the imaging tests through PACS system I reviewed available ER/hospitalization records through the EMR    EKG Interpretation None      MDM   Final diagnoses:  Low back pain  Fall    Concern for possible compression fracture given follow-up to his bottom last night.  X-ray pending.  11:23 AM Xray negative. Pt left prior to disposition. i was unable to give return precautions and reevaluate his  pain after management in the ER   Lyanne CoKevin M Janely Gullickson, MD 10/07/14 1123

## 2015-01-11 ENCOUNTER — Emergency Department (HOSPITAL_COMMUNITY)
Admission: EM | Admit: 2015-01-11 | Discharge: 2015-01-11 | Discharge status: Against Medical Advice | Payer: Self-pay | Attending: Emergency Medicine | Admitting: Emergency Medicine

## 2015-01-11 ENCOUNTER — Encounter (HOSPITAL_COMMUNITY): Payer: Self-pay | Admitting: Emergency Medicine

## 2015-01-11 ENCOUNTER — Emergency Department (HOSPITAL_COMMUNITY): Payer: Self-pay

## 2015-01-11 DIAGNOSIS — Z720 Tobacco use: Secondary | ICD-10-CM | POA: Insufficient documentation

## 2015-01-11 DIAGNOSIS — S6992XA Unspecified injury of left wrist, hand and finger(s), initial encounter: Secondary | ICD-10-CM | POA: Insufficient documentation

## 2015-01-11 DIAGNOSIS — Y9289 Other specified places as the place of occurrence of the external cause: Secondary | ICD-10-CM | POA: Insufficient documentation

## 2015-01-11 DIAGNOSIS — Y998 Other external cause status: Secondary | ICD-10-CM | POA: Insufficient documentation

## 2015-01-11 DIAGNOSIS — Y9389 Activity, other specified: Secondary | ICD-10-CM | POA: Insufficient documentation

## 2015-01-11 NOTE — ED Notes (Signed)
Pt called for room twice from triage and once from xray
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
## 2015-01-11 NOTE — ED Notes (Signed)
Pt called a third time from triage with no answer

## 2015-01-19 ENCOUNTER — Encounter (HOSPITAL_COMMUNITY): Payer: Self-pay

## 2015-01-19 ENCOUNTER — Emergency Department (HOSPITAL_COMMUNITY)
Admission: EM | Admit: 2015-01-19 | Discharge: 2015-01-19 | Disposition: A | Discharge status: Home / Self Care | Payer: Self-pay | Attending: Emergency Medicine | Admitting: Emergency Medicine

## 2015-01-19 ENCOUNTER — Emergency Department (HOSPITAL_COMMUNITY): Payer: Self-pay

## 2015-01-19 DIAGNOSIS — G4489 Other headache syndrome: Secondary | ICD-10-CM | POA: Insufficient documentation

## 2015-01-19 DIAGNOSIS — R55 Syncope and collapse: Secondary | ICD-10-CM | POA: Insufficient documentation

## 2015-01-19 DIAGNOSIS — Z79899 Other long term (current) drug therapy: Secondary | ICD-10-CM | POA: Insufficient documentation

## 2015-01-19 DIAGNOSIS — Z720 Tobacco use: Secondary | ICD-10-CM | POA: Insufficient documentation

## 2015-01-19 LAB — CBC WITH DIFFERENTIAL/PLATELET
BASOS PCT: 0 % (ref 0–1)
Basophils Absolute: 0 10*3/uL (ref 0.0–0.1)
Eosinophils Absolute: 0.1 10*3/uL (ref 0.0–0.7)
Eosinophils Relative: 2 % (ref 0–5)
HEMATOCRIT: 40.8 % (ref 39.0–52.0)
Hemoglobin: 13.5 g/dL (ref 13.0–17.0)
Lymphocytes Relative: 22 % (ref 12–46)
Lymphs Abs: 1.1 10*3/uL (ref 0.7–4.0)
MCH: 30.1 pg (ref 26.0–34.0)
MCHC: 33.1 g/dL (ref 30.0–36.0)
MCV: 90.9 fL (ref 78.0–100.0)
MONO ABS: 0.5 10*3/uL (ref 0.1–1.0)
Monocytes Relative: 11 % (ref 3–12)
NEUTROS ABS: 3.2 10*3/uL (ref 1.7–7.7)
Neutrophils Relative %: 65 % (ref 43–77)
PLATELETS: 236 10*3/uL (ref 150–400)
RBC: 4.49 MIL/uL (ref 4.22–5.81)
RDW: 14.6 % (ref 11.5–15.5)
WBC: 4.8 10*3/uL (ref 4.0–10.5)

## 2015-01-19 LAB — BASIC METABOLIC PANEL
Anion gap: 11 (ref 5–15)
BUN: 7 mg/dL (ref 6–23)
CALCIUM: 8.8 mg/dL (ref 8.4–10.5)
CHLORIDE: 104 mmol/L (ref 96–112)
CO2: 27 mmol/L (ref 19–32)
Creatinine, Ser: 0.89 mg/dL (ref 0.50–1.35)
GFR calc Af Amer: >90 mL/min (ref >90)
GFR calc non Af Amer: >90 mL/min (ref >90)
GLUCOSE: 85 mg/dL (ref 70–99)
Potassium: 3.6 mmol/L (ref 3.5–5.1)
Sodium: 142 mmol/L (ref 135–145)

## 2015-01-19 LAB — CBG MONITORING, ED: GLUCOSE-CAPILLARY: 100 mg/dL — AB (ref 70–99)

## 2015-01-19 MED ORDER — DIPHENHYDRAMINE HCL 50 MG/ML IJ SOLN
25.0000 mg | Freq: Once | INTRAMUSCULAR | Status: AC
  Administered 2015-01-19: 25 mg via INTRAVENOUS
  Filled 2015-01-19: qty 1

## 2015-01-19 MED ORDER — METOCLOPRAMIDE HCL 5 MG/ML IJ SOLN
10.0000 mg | Freq: Once | INTRAMUSCULAR | Status: AC
  Administered 2015-01-19: 10 mg via INTRAVENOUS
  Filled 2015-01-19: qty 2

## 2015-01-19 MED ORDER — SODIUM CHLORIDE 0.9 % IV BOLUS (SEPSIS)
1000.0000 mL | Freq: Once | INTRAVENOUS | Status: AC
  Administered 2015-01-19: 1000 mL via INTRAVENOUS

## 2015-01-19 NOTE — ED Notes (Signed)
Pt states he has been dizzy with headache for "a while".  Pt states today he "blacked out".  He was alone. Denies head injury.  Nausea with no vomiting

## 2015-01-19 NOTE — ED Notes (Signed)
Patient transported to CT. Will administer medications and obtain blood sample when pt returns.

## 2015-01-19 NOTE — ED Provider Notes (Signed)
Pt ambulated w/o difficulty, feeling somehwat better, would like to go. Pt will be d/c'd per D.r Wickline's instructions, i have also encouraged [facility]   1. Syncope, unspecified syncope type   2. Other headache syndrome      Toy CookeyMegan Docherty, MD 01/19/15 [id number]

## 2015-01-19 NOTE — ED Provider Notes (Signed)
CSN: [id number]     Arrival date & time 01/19/15  1351 History   First MD Initiated Contact with Patient 01/19/15 1409     Chief Complaint  Patient presents with  . Loss of Consciousness     Patient is a 24 y.o. male presenting with syncope. The history is provided by the patient.  Loss of Consciousness Episode history:  Single Most recent episode:  Today Duration: unknown. Timing:  Constant Progression:  Improving Chronicity:  New Relieved by:  Nothing Worsened by:  Nothing tried Associated symptoms: headaches   Associated symptoms: no chest pain, no difficulty breathing, no fever, no focal weakness, no shortness of breath and no weakness   Risk factors: no coronary artery disease and no seizures   Pt reports he has had HA for 3 days He reports it was gradual in onset and worsened yesterday No head trauma No Seizure He reports h/o HA but this is worse and has lasted longer  Today he was sitting on couch and thinks he passed out He was alone so unclear how long it lasted He now reports HA and blurred vision No previous h/o syncope    PMH - none Family history - negative for CVA and negative for aneurysms History  Substance Use Topics  . Smoking status: Current Every Day Smoker -- 1.00 packs/day    Types: Cigarettes  . Smokeless tobacco: Never Used  . Alcohol Use: Yes    Review of Systems  Constitutional: Negative for fever.  Respiratory: Negative for shortness of breath.   Cardiovascular: Positive for syncope. Negative for chest pain.  Neurological: Positive for syncope and headaches. Negative for focal weakness and weakness.  All other systems reviewed and are negative.     Allergies  Review of patient's allergies indicates no known allergies.  Home Medications   Prior to Admission medications   Medication Sig Start Date End Date Taking? Authorizing Provider  albuterol (PROVENTIL HFA;VENTOLIN HFA) 108 (90 BASE) MCG/ACT inhaler Inhale 1 puff into the lungs  every 6 (six) hours as needed for wheezing or shortness of breath.   Yes Historical Provider, MD   BP 150/87 mmHg  Pulse 81  Temp(Src) 98.9 F (37.2 C) (Oral)  Resp 16  SpO2 97% Physical Exam CONSTITUTIONAL: Well developed/well nourished HEAD: Normocephalic/atraumatic EYES: EOMI/PERRL, no nystagmus, no ptosis ENMT: Mucous membranes moist NECK: supple no meningeal signs, no bruits SPINE/BACK:entire spine nontender CV: S1/S2 noted, no murmurs/rubs/gallops noted LUNGS: Lungs are clear to auscultation bilaterally, no apparent distress ABDOMEN: soft, nontender, no rebound or guarding GU:no cva tenderness NEURO:Awake/alert, facies symmetric, no arm or leg drift is noted Cranial nerves 3/4/5/6/04/20/09/11/12 tested and intact Sensation to light touch intact in all extremities EXTREMITIES: pulses normal, full ROM SKIN: warm, color normal PSYCH: flat affect  ED Course  Procedures  3:05 PM Pt reported to me that he had HA for 3 days He told nursing it was for "Awhile" He reports HA gradual onset, doubt SAH CT head was ordered due reported HA with ?syncope.  He is a poor historian and offers very little detail 3:43 PM Pt with some improvement CT head negative Labs pending At time of signout to dr docherty, f/u on labs, ambulate patient and if improved he can go home  Labs Review Labs Reviewed  CBC WITH DIFFERENTIAL/PLATELET  BASIC METABOLIC PANEL    Imaging Review Ct Head Wo Contrast  01/19/2015   CLINICAL DATA:  24 year old male with loss of consciousness today. Subacute headache. Nausea without vomiting.  EXAM: CT HEAD WITHOUT CONTRAST  TECHNIQUE: Contiguous axial images were obtained from the base of the skull through the vertex without intravenous contrast.  COMPARISON:  10/24/2007 CT.  FINDINGS: No intracranial abnormalities are identified, including mass lesion or mass effect, hydrocephalus, extra-axial fluid collection, midline shift, hemorrhage, or acute infarction.  The  visualized bony calvarium is unremarkable.  IMPRESSION: Unremarkable noncontrast head CT.   Electronically Signed   By: Harmon Pier M.D.   On: 01/19/2015 14:40     EKG Interpretation   Date/Time:  Friday January 19 2015 14:01:59 EDT Ventricular Rate:  79 PR Interval:  182 QRS Duration: 97 QT Interval:  357 QTC Calculation: 409 R Axis:   89 Text Interpretation:  Sinus rhythm Probable left atrial enlargement No  previous ECGs available Confirmed by Bebe Shaggy  MD, Lamar Naef ([id number]) on  01/19/2015 2:10:38 PM     Medications  metoCLOPramide (REGLAN) injection 10 mg (10 mg Intravenous Given 01/19/15 1449)  diphenhydrAMINE (BENADRYL) injection 25 mg (25 mg Intravenous Given 01/19/15 1449)  sodium chloride 0.9 % bolus 1,000 mL (1,000 mLs Intravenous New Bag/Given 01/19/15 1448)    MDM   Final diagnoses:  Syncope, unspecified syncope type  Other headache syndrome    Nursing notes including past medical history and social history reviewed and considered in documentation Labs/vital reviewed myself and considered during evaluation     Zadie Rhine, MD 01/19/15 1544

## 2015-01-19 NOTE — ED Notes (Signed)
Patient returned from CT

## 2015-01-19 NOTE — Discharge Instructions (Signed)
Driving and Equipment Restrictions Some medical problems make it dangerous to drive, ride a bike, or use machines. Some of these problems are:  A hard blow to the head (concussion).  Passing out (fainting).  Twitching and shaking (seizures).  Low blood sugar.  Taking medicine to help you relax (sedatives).  Taking pain medicines.  Wearing an eye patch.  Wearing splints. This can make it hard to use parts of your body that you need to drive safely. HOME CARE   Do not drive until your doctor says it is okay.  Do not use machines until your doctor says it is okay. You may need a form signed by your doctor (medical release) before you can drive again. You may also need this form before you do other tasks where you need to be fully alert. MAKE SURE YOU:  Understand these instructions.  Will watch your condition.  Will get help right away if you are not doing well or get worse. Document Released: 11/06/2004 Document Revised: 12/22/2011 Document Reviewed: 02/06/2010 [facility] Patient Information 2015 [place] [place]. This information is not intended to replace advice given to you by your health care provider. Make sure you discuss any questions you have with your health care provider.   You are having a headache. No specific cause was found today for your headache. It may have been a migraine or other cause of headache. Stress, anxiety, fatigue, and depression are common triggers for headaches. Your headache today does not appear to be life-threatening or require hospitalization, but often the exact cause of headaches is not determined in the emergency department. Therefore, follow-up with your doctor is very important to find out what may have caused your headache, and whether or not you need any further diagnostic testing or treatment. Sometimes headaches can appear benign (not harmful), but then more serious symptoms can develop which should prompt an immediate re-evaluation by your  doctor or the emergency department.  SEEK MEDICAL ATTENTION IF:  You develop possible problems with medications prescribed.  The medications don't resolve your headache, if it recurs , or if you have multiple episodes of vomiting or can't take fluids. You have a change from the usual headache.  RETURN IMMEDIATELY IF you develop a sudden, severe headache or confusion, become poorly responsive or faint, develop a fever above 100.42F or problem breathing, have a change in speech, vision, swallowing, or understanding, or develop new weakness, numbness, tingling, incoordination, or have a seizure.

## 2015-11-25 ENCOUNTER — Emergency Department (HOSPITAL_COMMUNITY): Payer: No Typology Code available for payment source

## 2015-11-25 ENCOUNTER — Emergency Department (HOSPITAL_COMMUNITY)
Admission: EM | Admit: 2015-11-25 | Discharge: 2015-11-25 | Discharge status: Against Medical Advice | Payer: No Typology Code available for payment source | Attending: Emergency Medicine | Admitting: Emergency Medicine

## 2015-11-25 ENCOUNTER — Encounter (HOSPITAL_COMMUNITY): Payer: Self-pay | Admitting: Emergency Medicine

## 2015-11-25 DIAGNOSIS — S0990XA Unspecified injury of head, initial encounter: Secondary | ICD-10-CM | POA: Insufficient documentation

## 2015-11-25 DIAGNOSIS — J45901 Unspecified asthma with (acute) exacerbation: Secondary | ICD-10-CM | POA: Diagnosis not present

## 2015-11-25 DIAGNOSIS — Y92410 Unspecified street and highway as the place of occurrence of the external cause: Secondary | ICD-10-CM | POA: Insufficient documentation

## 2015-11-25 DIAGNOSIS — F17210 Nicotine dependence, cigarettes, uncomplicated: Secondary | ICD-10-CM | POA: Insufficient documentation

## 2015-11-25 DIAGNOSIS — Y999 Unspecified external cause status: Secondary | ICD-10-CM | POA: Diagnosis not present

## 2015-11-25 DIAGNOSIS — S299XXA Unspecified injury of thorax, initial encounter: Secondary | ICD-10-CM | POA: Insufficient documentation

## 2015-11-25 DIAGNOSIS — Y9389 Activity, other specified: Secondary | ICD-10-CM | POA: Diagnosis not present

## 2015-11-25 DIAGNOSIS — S199XXA Unspecified injury of neck, initial encounter: Secondary | ICD-10-CM | POA: Diagnosis present

## 2015-11-25 DIAGNOSIS — S161XXA Strain of muscle, fascia and tendon at neck level, initial encounter: Secondary | ICD-10-CM | POA: Diagnosis not present

## 2015-11-25 HISTORY — DX: Unspecified asthma, uncomplicated: J45.909

## 2015-11-25 MED ORDER — TRAMADOL HCL 50 MG PO TABS: 50.0000 mg | ORAL_TABLET | Freq: Four times a day (QID) | ORAL | Status: DC | PRN

## 2015-11-25 MED ORDER — IBUPROFEN 800 MG PO TABS: 800.0000 mg | ORAL_TABLET | Freq: Three times a day (TID) | ORAL | Status: DC | PRN

## 2015-11-25 MED ORDER — SODIUM CHLORIDE 0.9 % IV BOLUS (SEPSIS): 1000.0000 mL | Freq: Once | INTRAVENOUS | Status: DC

## 2015-11-25 MED ORDER — FENTANYL CITRATE (PF) 100 MCG/2ML IJ SOLN: 100.0000 ug | Freq: Once | INTRAMUSCULAR | Status: DC

## 2015-11-25 MED ORDER — MORPHINE SULFATE (PF) 4 MG/ML IV SOLN
4.0000 mg | Freq: Once | INTRAVENOUS | Status: AC
  Administered 2015-11-25: 4 mg via INTRAVENOUS
  Filled 2015-11-25: qty 1

## 2015-11-25 MED ORDER — IOHEXOL 300 MG/ML  SOLN
75.0000 mL | Freq: Once | INTRAMUSCULAR | Status: AC | PRN
  Administered 2015-11-25: 75 mL via INTRAVENOUS

## 2015-11-25 NOTE — Discharge Instructions (Signed)
You need to return here for any worsening in her condition.  We did want to do a CT scan of her abdomen is you are having abdominal pain, if your condition changes or worsens return here for recheck.

## 2015-11-25 NOTE — ED Notes (Signed)
Pt is AA&OX4, ambulatory with steady gait.

## 2015-11-25 NOTE — ED Notes (Signed)
Patient transported to CT 
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
## 2017-08-23 ENCOUNTER — Other Ambulatory Visit: Payer: Self-pay

## 2017-08-23 ENCOUNTER — Emergency Department (HOSPITAL_COMMUNITY)
Admission: EM | Admit: 2017-08-23 | Discharge: 2017-08-24 | Disposition: A | Discharge status: Home / Self Care | Payer: Self-pay | Attending: Emergency Medicine | Admitting: Emergency Medicine

## 2017-08-23 ENCOUNTER — Encounter (HOSPITAL_COMMUNITY): Payer: Self-pay | Admitting: *Deleted

## 2017-08-23 DIAGNOSIS — Z79899 Other long term (current) drug therapy: Secondary | ICD-10-CM | POA: Insufficient documentation

## 2017-08-23 DIAGNOSIS — K0889 Other specified disorders of teeth and supporting structures: Secondary | ICD-10-CM | POA: Insufficient documentation

## 2017-08-23 DIAGNOSIS — F17210 Nicotine dependence, cigarettes, uncomplicated: Secondary | ICD-10-CM | POA: Insufficient documentation

## 2017-08-23 DIAGNOSIS — J45909 Unspecified asthma, uncomplicated: Secondary | ICD-10-CM | POA: Insufficient documentation

## 2017-08-23 NOTE — ED Triage Notes (Signed)
Pt c/o left upper toothache x 2 days.  Pt with poor oral hygiene.  Multiple teeth with decay.

## 2017-08-24 MED ORDER — ACETAMINOPHEN 500 MG PO TABS
1000.0000 mg | ORAL_TABLET | Freq: Once | ORAL | Status: AC
  Administered 2017-08-24: 1000 mg via ORAL
  Filled 2017-08-24: qty 2

## 2017-08-24 MED ORDER — IBUPROFEN 800 MG PO TABS
800.0000 mg | ORAL_TABLET | Freq: Once | ORAL | Status: AC
  Administered 2017-08-24: 800 mg via ORAL
  Filled 2017-08-24: qty 1

## 2017-08-24 MED ORDER — PENICILLIN V POTASSIUM 500 MG PO TABS
500.0000 mg | ORAL_TABLET | Freq: Once | ORAL | Status: AC
  Administered 2017-08-24: 500 mg via ORAL
  Filled 2017-08-24: qty 1

## 2017-08-24 MED ORDER — PENICILLIN V POTASSIUM 500 MG PO TABS: 500.0000 mg | ORAL_TABLET | Freq: Four times a day (QID) | ORAL | 0 refills | Status: DC
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
## 2017-08-24 NOTE — ED Notes (Signed)
ED Provider at bedside. 

## 2017-08-25 ENCOUNTER — Encounter (HOSPITAL_COMMUNITY): Payer: Self-pay | Admitting: Emergency Medicine

## 2017-08-25 ENCOUNTER — Emergency Department (HOSPITAL_COMMUNITY): Payer: No Typology Code available for payment source

## 2017-08-25 ENCOUNTER — Emergency Department (HOSPITAL_COMMUNITY)
Admission: EM | Admit: 2017-08-25 | Discharge: 2017-08-25 | Disposition: A | Discharge status: Home / Self Care | Payer: No Typology Code available for payment source | Attending: Emergency Medicine | Admitting: Emergency Medicine

## 2017-08-25 DIAGNOSIS — S161XXA Strain of muscle, fascia and tendon at neck level, initial encounter: Secondary | ICD-10-CM | POA: Insufficient documentation

## 2017-08-25 DIAGNOSIS — S39012A Strain of muscle, fascia and tendon of lower back, initial encounter: Secondary | ICD-10-CM | POA: Insufficient documentation

## 2017-08-25 DIAGNOSIS — Y999 Unspecified external cause status: Secondary | ICD-10-CM | POA: Diagnosis not present

## 2017-08-25 DIAGNOSIS — Y939 Activity, unspecified: Secondary | ICD-10-CM | POA: Diagnosis not present

## 2017-08-25 DIAGNOSIS — J45909 Unspecified asthma, uncomplicated: Secondary | ICD-10-CM | POA: Insufficient documentation

## 2017-08-25 DIAGNOSIS — S3992XA Unspecified injury of lower back, initial encounter: Secondary | ICD-10-CM | POA: Diagnosis present

## 2017-08-25 DIAGNOSIS — Y929 Unspecified place or not applicable: Secondary | ICD-10-CM | POA: Insufficient documentation

## 2017-08-25 DIAGNOSIS — F17210 Nicotine dependence, cigarettes, uncomplicated: Secondary | ICD-10-CM | POA: Insufficient documentation

## 2017-08-25 DIAGNOSIS — Z79899 Other long term (current) drug therapy: Secondary | ICD-10-CM | POA: Insufficient documentation

## 2017-08-25 MED ORDER — OXYCODONE-ACETAMINOPHEN 5-325 MG PO TABS
1.0000 | ORAL_TABLET | Freq: Once | ORAL | Status: AC
  Administered 2017-08-25: 1 via ORAL
  Filled 2017-08-25: qty 1

## 2017-08-25 MED ORDER — CYCLOBENZAPRINE HCL 10 MG PO TABS: 10.0000 mg | ORAL_TABLET | Freq: Two times a day (BID) | ORAL | 0 refills | Status: DC | PRN

## 2017-08-25 MED ORDER — PREDNISONE 10 MG PO TABS: 20.0000 mg | ORAL_TABLET | Freq: Two times a day (BID) | ORAL | 0 refills | Status: DC

## 2017-08-25 MED ORDER — CYCLOBENZAPRINE HCL 10 MG PO TABS
10.0000 mg | ORAL_TABLET | Freq: Once | ORAL | Status: AC
Start: 2017-08-25 — End: 2017-08-25
  Administered 2017-08-25: 10 mg via ORAL
  Filled 2017-08-25: qty 1

## 2017-08-25 NOTE — ED Notes (Signed)
Patient transported to X-ray 

## 2017-08-25 NOTE — Discharge Instructions (Signed)
Do not drive while taking the muscle relaxant as it will make you sleepy. Follow up with your doctor or return here for worsening symptoms.

## 2017-08-25 NOTE — ED Notes (Signed)
Pt ambulatory and independent at discharge.  Verbalized understanding of discharge instructions 
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
## 2019-10-28 ENCOUNTER — Encounter (HOSPITAL_COMMUNITY): Payer: Self-pay

## 2019-10-28 ENCOUNTER — Ambulatory Visit (HOSPITAL_COMMUNITY)
Admission: EM | Admit: 2019-10-28 | Discharge: 2019-10-28 | Disposition: A | Discharge status: Home / Self Care | Payer: HRSA Program | Attending: Emergency Medicine | Admitting: Emergency Medicine

## 2019-10-28 ENCOUNTER — Other Ambulatory Visit: Payer: Self-pay

## 2019-10-28 DIAGNOSIS — Z20822 Contact with and (suspected) exposure to covid-19: Secondary | ICD-10-CM | POA: Insufficient documentation

## 2019-10-28 NOTE — Discharge Instructions (Signed)
Take tylenol 325mg tablet x 2 up to every 6 hours for sore throat, fever and body aches. Do not exceed 8 tablets in 24 hours ° °If your Covid-19 test is positive, you will receive a phone call from [facility] regarding your results. Negative test results are not called. °Both positive and negative results area always visible on MyChart. °If you do not have a MyChart account, sign up instructions are in your discharge papers. ° ° °Persons who are directed to care for themselves at home may discontinue isolation under the following conditions: ° ° At least 10 days have passed since symptom onset and ° At least 24 hours have passed without running a fever (this means without the use of fever-reducing medications) and ° Other symptoms have improved. ° °Persons infected with COVID-19 who never develop symptoms may discontinue isolation and other precautions 10 days after the date of their first positive COVID-19 test. ° ° °
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
## 2019-10-28 NOTE — ED Triage Notes (Signed)
Pt presents to UC for COVID testing after exposure 3 days ago. Pt denies ny sings and symptoms.

## 2019-10-30 LAB — NOVEL CORONAVIRUS, NAA (HOSP ORDER, SEND-OUT TO REF LAB; TAT 18-24 HRS): SARS-CoV-2, NAA: NOT DETECTED

## 2020-05-03 ENCOUNTER — Ambulatory Visit (HOSPITAL_COMMUNITY)
Admission: EM | Admit: 2020-05-03 | Discharge: 2020-05-03 | Disposition: A | Discharge status: Home / Self Care | Payer: Self-pay

## 2020-05-03 ENCOUNTER — Other Ambulatory Visit: Payer: Self-pay

## 2020-12-30 ENCOUNTER — Emergency Department (HOSPITAL_COMMUNITY)
Admission: EM | Admit: 2020-12-30 | Discharge: 2020-12-31 | Disposition: A | Discharge status: Home / Self Care | Payer: Self-pay | Attending: Emergency Medicine | Admitting: Emergency Medicine

## 2020-12-30 ENCOUNTER — Other Ambulatory Visit: Payer: Self-pay

## 2020-12-30 DIAGNOSIS — K047 Periapical abscess without sinus: Secondary | ICD-10-CM | POA: Insufficient documentation

## 2020-12-30 DIAGNOSIS — K0889 Other specified disorders of teeth and supporting structures: Secondary | ICD-10-CM

## 2020-12-30 DIAGNOSIS — J45909 Unspecified asthma, uncomplicated: Secondary | ICD-10-CM | POA: Insufficient documentation

## 2020-12-30 DIAGNOSIS — F17210 Nicotine dependence, cigarettes, uncomplicated: Secondary | ICD-10-CM | POA: Insufficient documentation

## 2020-12-30 LAB — COMPREHENSIVE METABOLIC PANEL
ALT: 11 U/L (ref 0–44)
AST: 26 U/L (ref 15–41)
Albumin: 4 g/dL (ref 3.5–5.0)
Alkaline Phosphatase: 62 U/L (ref 38–126)
Anion gap: 6 (ref 5–15)
BUN: 9 mg/dL (ref 6–20)
CO2: 29 mmol/L (ref 22–32)
Calcium: 9.4 mg/dL (ref 8.9–10.3)
Chloride: 105 mmol/L (ref 98–111)
Creatinine, Ser: 0.92 mg/dL (ref 0.61–1.24)
GFR, Estimated: >60 mL/min (ref >60)
Glucose, Bld: 94 mg/dL (ref 70–99)
Potassium: 4.4 mmol/L (ref 3.5–5.1)
Sodium: 140 mmol/L (ref 135–145)
Total Bilirubin: 0.6 mg/dL (ref 0.3–1.2)
Total Protein: 6.6 g/dL (ref 6.5–8.1)

## 2020-12-30 LAB — CBC WITH DIFFERENTIAL/PLATELET
Abs Immature Granulocytes: 0.02 10*3/uL (ref 0.00–0.07)
Basophils Absolute: 0.1 10*3/uL (ref 0.0–0.1)
Basophils Relative: 1 %
Eosinophils Absolute: 0.3 10*3/uL (ref 0.0–0.5)
Eosinophils Relative: 4 %
HCT: 41.5 % (ref 39.0–52.0)
Hemoglobin: 13.5 g/dL (ref 13.0–17.0)
Immature Granulocytes: 0 %
Lymphocytes Relative: 44 %
Lymphs Abs: 3.5 10*3/uL (ref 0.7–4.0)
MCH: 29.7 pg (ref 26.0–34.0)
MCHC: 32.5 g/dL (ref 30.0–36.0)
MCV: 91.4 fL (ref 80.0–100.0)
Monocytes Absolute: 0.4 10*3/uL (ref 0.1–1.0)
Monocytes Relative: 5 %
Neutro Abs: 3.7 10*3/uL (ref 1.7–7.7)
Neutrophils Relative %: 46 %
Platelets: 289 10*3/uL (ref 150–400)
RBC: 4.54 MIL/uL (ref 4.22–5.81)
RDW: 13.7 % (ref 11.5–15.5)
WBC: 7.9 10*3/uL (ref 4.0–10.5)
nRBC: 0 % (ref 0.0–0.2)

## 2020-12-30 NOTE — ED Triage Notes (Addendum)
Pt reports dental pain on left side of mouth  that has gotten worse today and took tylenol with no relief. Pt states it "like half of tooth in gums"

## 2020-12-31 MED ORDER — PENICILLIN V POTASSIUM 500 MG PO TABS: 500.0000 mg | ORAL_TABLET | Freq: Four times a day (QID) | ORAL | 0 refills | Status: DC

## 2020-12-31 MED ORDER — NAPROXEN 500 MG PO TABS: 500.0000 mg | ORAL_TABLET | Freq: Two times a day (BID) | ORAL | 0 refills | Status: DC

## 2020-12-31 NOTE — Discharge Instructions (Signed)
Take the prescribed medication as directed-- sent to pharmacy for you. Dr. Garvin Fila is dentist on call.  You can try to follow-up with him or I have attached list of other local clinics. Return to the ED for new or worsening symptoms.
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
## 2020-12-31 NOTE — ED Notes (Signed)
Patient verbalizes understanding of discharge instructions. Prescriptions and follow-up care reviewed. Opportunity for questioning and answers were provided. Armband removed by staff, pt discharged from ED ambulatory.  

## 2021-03-08 ENCOUNTER — Emergency Department (HOSPITAL_COMMUNITY)
Admission: EM | Admit: 2021-03-08 | Discharge: 2021-03-09 | Disposition: A | Discharge status: Home / Self Care | Payer: Self-pay | Attending: Emergency Medicine | Admitting: Emergency Medicine

## 2021-03-08 ENCOUNTER — Other Ambulatory Visit: Payer: Self-pay

## 2021-03-08 ENCOUNTER — Encounter (HOSPITAL_COMMUNITY): Payer: Self-pay | Admitting: Emergency Medicine

## 2021-03-08 DIAGNOSIS — Z5321 Procedure and treatment not carried out due to patient leaving prior to being seen by health care provider: Secondary | ICD-10-CM | POA: Insufficient documentation

## 2021-03-08 DIAGNOSIS — K920 Hematemesis: Secondary | ICD-10-CM | POA: Insufficient documentation

## 2021-03-08 DIAGNOSIS — R1084 Generalized abdominal pain: Secondary | ICD-10-CM | POA: Insufficient documentation

## 2021-03-08 LAB — CBC
HCT: 44.7 % (ref 39.0–52.0)
Hemoglobin: 15 g/dL (ref 13.0–17.0)
MCH: 30.1 pg (ref 26.0–34.0)
MCHC: 33.6 g/dL (ref 30.0–36.0)
MCV: 89.8 fL (ref 80.0–100.0)
Platelets: 319 10*3/uL (ref 150–400)
RBC: 4.98 MIL/uL (ref 4.22–5.81)
RDW: 14.5 % (ref 11.5–15.5)
WBC: 6.7 10*3/uL (ref 4.0–10.5)
nRBC: 0 % (ref 0.0–0.2)

## 2021-03-08 MED ORDER — ONDANSETRON 4 MG PO TBDP
4.0000 mg | ORAL_TABLET | Freq: Once | ORAL | Status: AC
  Administered 2021-03-08: 4 mg via ORAL
  Filled 2021-03-08: qty 1

## 2021-03-08 NOTE — ED Triage Notes (Signed)
Patient reports pain across his abdomen with bloody emesis x2 today , no emesis , denies fever or chills .

## 2021-03-08 NOTE — ED Provider Notes (Signed)
Emergency Medicine Provider Triage Evaluation Note  Eric Valencia , a 30 y.o. male  was evaluated in triage.  Pt complains of vomiting.  The patient endorses 2 episodes of vomiting earlier today.  He states that emesis was dark, but denies black tarry emesis.  He did notice a few small specks of bright red blood, but no clots.  He is also endorsing generalized abdominal pain, onset today, and nausea. No known aggravating or alleviating factors.  No chest pain, shortness of breath, dizziness, lightheadedness, syncope, diarrhea, constipation, dysuria, hematuria.  No recent NSAID use.  No history of GI bleeds.  Patient denies alcohol use.  No recent Pepto-Bismol use.  No treatment for symptoms prior to arrival.  Review of Systems  Positive: Nausea, vomiting, hematemesis, abdominal pain Negative: Fever, chills, chest pain, shortness of breath, dizziness, headedness, syncope, diarrhea, constipation, dysuria, hematuria  Physical Exam  Ht 5\' 6"  (1.676 m)   Wt 90 kg   BMI 32.02 kg/m  Gen:   Awake, no distress    Resp:  Normal effort    MSK:   Moves extremities without difficulty   Other:  Generalized tenderness to palpation throughout the abdomen.  No rebound or guarding.  No tenderness over McBurney's point.  Negative Murphy sign.  Medical Decision Making  Medically screening exam initiated at 10:38 PM.  Appropriate orders placed.  Eric Valencia was informed that the remainder of the evaluation will be completed by another provider, this initial triage assessment does not replace that evaluation, and the importance of remaining in the ED until their evaluation is complete.  30 year old male presenting to the ER with concern for hematemesis.  Emesis has been dark, but not black and tarry.  He has noticed a small amount of bright red specks in his emesis.  No history of GI bleed.  No risk factors for GI bleed.  No recent retching or vomiting prior to today.  Will labs.  Zofran given for  nausea.  Patient will require further work-up and treatment in the emergency department.   26 A, PA-C 03/08/21 2304    Little, 03/10/21, MD 03/08/21 [phone]

## 2021-03-09 LAB — COMPREHENSIVE METABOLIC PANEL
ALT: 17 U/L (ref 0–44)
AST: 27 U/L (ref 15–41)
Albumin: 4.1 g/dL (ref 3.5–5.0)
Alkaline Phosphatase: 59 U/L (ref 38–126)
Anion gap: 7 (ref 5–15)
BUN: 9 mg/dL (ref 6–20)
CO2: 28 mmol/L (ref 22–32)
Calcium: 9.4 mg/dL (ref 8.9–10.3)
Chloride: 105 mmol/L (ref 98–111)
Creatinine, Ser: 0.96 mg/dL (ref 0.61–1.24)
GFR, Estimated: >60 mL/min (ref >60)
Glucose, Bld: 92 mg/dL (ref 70–99)
Potassium: 4.5 mmol/L (ref 3.5–5.1)
Sodium: 140 mmol/L (ref 135–145)
Total Bilirubin: 0.8 mg/dL (ref 0.3–1.2)
Total Protein: 7 g/dL (ref 6.5–8.1)

## 2021-03-09 LAB — LIPASE, BLOOD: Lipase: 29 U/L (ref 11–51)

## 2021-03-09 NOTE — ED Notes (Signed)
Patient called for vitals recheck with no response 

## 2021-04-24 ENCOUNTER — Other Ambulatory Visit: Payer: Self-pay

## 2021-04-24 ENCOUNTER — Emergency Department (HOSPITAL_COMMUNITY)
Admission: EM | Admit: 2021-04-24 | Discharge: 2021-04-25 | Disposition: A | Discharge status: Home / Self Care | Payer: Self-pay | Attending: Emergency Medicine | Admitting: Emergency Medicine

## 2021-04-24 ENCOUNTER — Emergency Department (HOSPITAL_COMMUNITY): Payer: Self-pay

## 2021-04-24 ENCOUNTER — Encounter (HOSPITAL_COMMUNITY): Payer: Self-pay | Admitting: Emergency Medicine

## 2021-04-24 DIAGNOSIS — R519 Headache, unspecified: Secondary | ICD-10-CM | POA: Insufficient documentation

## 2021-04-24 DIAGNOSIS — R110 Nausea: Secondary | ICD-10-CM | POA: Insufficient documentation

## 2021-04-24 DIAGNOSIS — R079 Chest pain, unspecified: Secondary | ICD-10-CM | POA: Insufficient documentation

## 2021-04-24 DIAGNOSIS — F17210 Nicotine dependence, cigarettes, uncomplicated: Secondary | ICD-10-CM | POA: Insufficient documentation

## 2021-04-24 DIAGNOSIS — J45909 Unspecified asthma, uncomplicated: Secondary | ICD-10-CM | POA: Insufficient documentation

## 2021-04-24 DIAGNOSIS — H53149 Visual discomfort, unspecified: Secondary | ICD-10-CM | POA: Insufficient documentation

## 2021-04-24 LAB — CBC WITH DIFFERENTIAL/PLATELET
Abs Immature Granulocytes: 0.01 10*3/uL (ref 0.00–0.07)
Basophils Absolute: 0 10*3/uL (ref 0.0–0.1)
Basophils Relative: 0 %
Eosinophils Absolute: 0.1 10*3/uL (ref 0.0–0.5)
Eosinophils Relative: 1 %
HCT: 43.8 % (ref 39.0–52.0)
Hemoglobin: 14.2 g/dL (ref 13.0–17.0)
Immature Granulocytes: 0 %
Lymphocytes Relative: 34 %
Lymphs Abs: 2.3 10*3/uL (ref 0.7–4.0)
MCH: 29.4 pg (ref 26.0–34.0)
MCHC: 32.4 g/dL (ref 30.0–36.0)
MCV: 90.7 fL (ref 80.0–100.0)
Monocytes Absolute: 0.5 10*3/uL (ref 0.1–1.0)
Monocytes Relative: 8 %
Neutro Abs: 3.8 10*3/uL (ref 1.7–7.7)
Neutrophils Relative %: 57 %
Platelets: 323 10*3/uL (ref 150–400)
RBC: 4.83 MIL/uL (ref 4.22–5.81)
RDW: 13.8 % (ref 11.5–15.5)
WBC: 6.7 10*3/uL (ref 4.0–10.5)
nRBC: 0 % (ref 0.0–0.2)

## 2021-04-24 LAB — BASIC METABOLIC PANEL
Anion gap: 9 (ref 5–15)
BUN: 9 mg/dL (ref 6–20)
CO2: 29 mmol/L (ref 22–32)
Calcium: 9.6 mg/dL (ref 8.9–10.3)
Chloride: 100 mmol/L (ref 98–111)
Creatinine, Ser: 0.98 mg/dL (ref 0.61–1.24)
GFR, Estimated: >60 mL/min (ref >60)
Glucose, Bld: 93 mg/dL (ref 70–99)
Potassium: 3.6 mmol/L (ref 3.5–5.1)
Sodium: 138 mmol/L (ref 135–145)

## 2021-04-24 LAB — TROPONIN I (HIGH SENSITIVITY)
Troponin I (High Sensitivity): 4 ng/L (ref <18)
Troponin I (High Sensitivity): 5 ng/L (ref <18)

## 2021-04-24 MED ORDER — KETOROLAC TROMETHAMINE 30 MG/ML IJ SOLN
15.0000 mg | Freq: Once | INTRAMUSCULAR | Status: AC
  Administered 2021-04-25: 15 mg via INTRAVENOUS
  Filled 2021-04-24: qty 1

## 2021-04-24 MED ORDER — DIPHENHYDRAMINE HCL 50 MG/ML IJ SOLN
12.5000 mg | Freq: Once | INTRAMUSCULAR | Status: AC
  Administered 2021-04-25: 12.5 mg via INTRAVENOUS
  Filled 2021-04-24: qty 1

## 2021-04-24 MED ORDER — PROCHLORPERAZINE EDISYLATE 10 MG/2ML IJ SOLN
5.0000 mg | Freq: Once | INTRAMUSCULAR | Status: AC
  Administered 2021-04-25: 5 mg via INTRAVENOUS
  Filled 2021-04-24: qty 2
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
## 2021-04-24 NOTE — ED Triage Notes (Signed)
Pt reports headache that started yesterday and became worse today. Pt took a percocet and pain became worse and then also started central cp that radiates down his left arm. Pt reports 8/10 chest pain and 10/10 headache. Hx of htn, has not been taking bp medications.

## 2021-04-24 NOTE — ED Provider Notes (Signed)
Emergency Medicine Provider Triage Evaluation Note  Eric Valencia , a 30 y.o. male  was evaluated in triage.  Pt complains of patient presents with chest pain, started today that 2 hours ago, after he took a Percocet for his headache, chest pain is in the middle of his chest, radiates to his left arm, no associated shortness of breath, becoming diaphoretic, feeling lightheaded or dizziness.  He has no cardiac history, no history of PEs or DVTs..  Review of Systems  Positive: Chest pain, headache Negative: Shortness of breath abdominal pain  Physical Exam  BP (!) 146/110 (BP Location: Left Arm)   Pulse 89   Temp 98.4 F (36.9 C) (Oral)   Resp 18   Ht 5\' 6"  (1.676 m)   Wt 80.7 kg   SpO2 100%   BMI 28.73 kg/m  Gen:   Awake, no distress   Resp:  Normal effort  MSK:   Moves extremities without difficulty  Other:    Medical Decision Making  Medically screening exam initiated at 7:58 PM.  Appropriate orders placed.  Eric Valencia was informed that the remainder of the evaluation will be completed by another provider, this initial triage assessment does not replace that evaluation, and the importance of remaining in the ED until their evaluation is complete.  Chest pain, lab work has been ordered patient will need further work-up in the emergency department.   Paulina Fusi, PA-C 04/24/21 04/26/21    Babette Relic, MD 04/24/21 2306

## 2021-04-25 NOTE — Discharge Instructions (Addendum)
Follow-up with your primary care doctor as needed

## 2021-05-09 ENCOUNTER — Emergency Department (HOSPITAL_COMMUNITY): Payer: Self-pay

## 2021-05-09 ENCOUNTER — Encounter (HOSPITAL_COMMUNITY): Payer: Self-pay | Admitting: *Deleted

## 2021-05-09 ENCOUNTER — Other Ambulatory Visit: Payer: Self-pay

## 2021-05-09 ENCOUNTER — Emergency Department (HOSPITAL_COMMUNITY)
Admission: EM | Admit: 2021-05-09 | Discharge: 2021-05-09 | Disposition: A | Discharge status: Home / Self Care | Payer: Self-pay | Attending: Emergency Medicine | Admitting: Emergency Medicine

## 2021-05-09 DIAGNOSIS — R519 Headache, unspecified: Secondary | ICD-10-CM | POA: Insufficient documentation

## 2021-05-09 DIAGNOSIS — R55 Syncope and collapse: Secondary | ICD-10-CM | POA: Insufficient documentation

## 2021-05-09 DIAGNOSIS — R079 Chest pain, unspecified: Secondary | ICD-10-CM | POA: Insufficient documentation

## 2021-05-09 DIAGNOSIS — R0602 Shortness of breath: Secondary | ICD-10-CM | POA: Insufficient documentation

## 2021-05-09 LAB — CBC WITH DIFFERENTIAL/PLATELET
Abs Immature Granulocytes: 0.02 10*3/uL (ref 0.00–0.07)
Basophils Absolute: 0 10*3/uL (ref 0.0–0.1)
Basophils Relative: 1 %
Eosinophils Absolute: 0 10*3/uL (ref 0.0–0.5)
Eosinophils Relative: 1 %
HCT: 43.7 % (ref 39.0–52.0)
Hemoglobin: 14.3 g/dL (ref 13.0–17.0)
Immature Granulocytes: 0 %
Lymphocytes Relative: 29 %
Lymphs Abs: 1.9 10*3/uL (ref 0.7–4.0)
MCH: 29.6 pg (ref 26.0–34.0)
MCHC: 32.7 g/dL (ref 30.0–36.0)
MCV: 90.5 fL (ref 80.0–100.0)
Monocytes Absolute: 0.5 10*3/uL (ref 0.1–1.0)
Monocytes Relative: 8 %
Neutro Abs: 4 10*3/uL (ref 1.7–7.7)
Neutrophils Relative %: 61 %
Platelets: 287 10*3/uL (ref 150–400)
RBC: 4.83 MIL/uL (ref 4.22–5.81)
RDW: 13.7 % (ref 11.5–15.5)
WBC: 6.5 10*3/uL (ref 4.0–10.5)
nRBC: 0 % (ref 0.0–0.2)

## 2021-05-09 LAB — COMPREHENSIVE METABOLIC PANEL
ALT: 17 U/L (ref 0–44)
AST: 24 U/L (ref 15–41)
Albumin: 4.2 g/dL (ref 3.5–5.0)
Alkaline Phosphatase: 62 U/L (ref 38–126)
Anion gap: 9 (ref 5–15)
BUN: 6 mg/dL (ref 6–20)
CO2: 25 mmol/L (ref 22–32)
Calcium: 9.5 mg/dL (ref 8.9–10.3)
Chloride: 106 mmol/L (ref 98–111)
Creatinine, Ser: 0.89 mg/dL (ref 0.61–1.24)
GFR, Estimated: >60 mL/min (ref >60)
Glucose, Bld: 92 mg/dL (ref 70–99)
Potassium: 3.3 mmol/L — ABNORMAL LOW (ref 3.5–5.1)
Sodium: 140 mmol/L (ref 135–145)
Total Bilirubin: 0.9 mg/dL (ref 0.3–1.2)
Total Protein: 7 g/dL (ref 6.5–8.1)

## 2021-05-09 LAB — PROTIME-INR
INR: 1.1 (ref 0.8–1.2)
Prothrombin Time: 14.5 seconds (ref 11.4–15.2)

## 2021-05-09 LAB — CBG MONITORING, ED: Glucose-Capillary: 87 mg/dL (ref 70–99)

## 2021-05-09 LAB — TROPONIN I (HIGH SENSITIVITY): Troponin I (High Sensitivity): 4 ng/L (ref <18)

## 2021-05-09 NOTE — ED Provider Notes (Signed)
Emergency Medicine Provider Triage Evaluation Note  Eric Valencia , a 30 y.o. male  was evaluated in triage.  Pt complains of syncopal episode, states she was at home today and felt chest pain, shortness of breath, hot and sweaty and then his vision started to go away and he fell onto the ground, states that no one saw him pass out, his friends found him on the ground and sat him up on the couch.  He endorses after he passed out he now has a severe headache, is all over his head, he denies history of migraines, denies change in vision, paresthesias or weakness to upper or lower extremities, is not on anticoagulant.  Also notes that he has right-sided chest pain, does not radiate, no cardiac history, no history of PEs or DVTs, currently not on hormone therapy.  Denies illicit drug use..  Review of Systems  Positive: Headaches, chest pain Negative: Pedal edema, orthopnea  Physical Exam  BP (!) 144/97 (BP Location: Right Arm)   Pulse 76   Temp 98.1 F (36.7 C) (Oral)   Resp 18   SpO2 97%  Gen:   Awake, no distress   Resp:  Normal effort  MSK:   Moves extremities without difficulty  Other:  No facial asymmetry, no difficulty with word finding, no slurring of his words, able to follow commands, no unilateral weakness present.  Medical Decision Making  Medically screening exam initiated at 12:39 PM.  Appropriate orders placed.  ALTON BOUKNIGHT was informed that the remainder of the evaluation will be completed by another provider, this initial triage assessment does not replace that evaluation, and the importance of remaining in the ED until their evaluation is complete.  Presents with chest pain, headaches patient need further work-up.   Carroll Sage, PA-C 05/09/21 1241    Alvira Monday, MD 05/09/21 [phone]

## 2021-05-09 NOTE — ED Notes (Signed)
Called pts name 3x for updated VS with no response in lobby or outside.  

## 2021-05-09 NOTE — ED Triage Notes (Signed)
Pt reports having syncopal episode pta. Now has severe headache with nausea. Denies any other pain. No acute distress noted at triage.

## 2021-05-09 NOTE — ED Notes (Signed)
Called pt for vitals, no response.

## 2021-08-10 ENCOUNTER — Ambulatory Visit (HOSPITAL_COMMUNITY)
Admission: EM | Admit: 2021-08-10 | Discharge: 2021-08-10 | Disposition: A | Discharge status: Home / Self Care | Payer: Self-pay | Attending: Emergency Medicine | Admitting: Emergency Medicine

## 2021-08-10 ENCOUNTER — Encounter (HOSPITAL_COMMUNITY): Payer: Self-pay

## 2021-08-10 ENCOUNTER — Other Ambulatory Visit: Payer: Self-pay

## 2021-08-10 DIAGNOSIS — R519 Headache, unspecified: Secondary | ICD-10-CM

## 2021-08-10 HISTORY — DX: Essential (primary) hypertension: I10

## 2021-08-10 MED ORDER — SUMATRIPTAN SUCCINATE 6 MG/0.5ML ~~LOC~~ SOLN
SUBCUTANEOUS | Status: AC
  Filled 2021-08-10: qty 0.5

## 2021-08-10 MED ORDER — DEXAMETHASONE SODIUM PHOSPHATE 10 MG/ML IJ SOLN
10.0000 mg | Freq: Once | INTRAMUSCULAR | Status: AC
  Administered 2021-08-10: 10 mg via INTRAMUSCULAR

## 2021-08-10 MED ORDER — SUMATRIPTAN SUCCINATE 6 MG/0.5ML ~~LOC~~ SOLN
6.0000 mg | Freq: Once | SUBCUTANEOUS | Status: AC
  Administered 2021-08-10: 6 mg via SUBCUTANEOUS

## 2021-08-10 MED ORDER — METOCLOPRAMIDE HCL 5 MG/ML IJ SOLN
5.0000 mg | Freq: Once | INTRAMUSCULAR | Status: AC
  Administered 2021-08-10: 5 mg via INTRAMUSCULAR

## 2021-08-10 MED ORDER — DEXAMETHASONE SODIUM PHOSPHATE 10 MG/ML IJ SOLN
INTRAMUSCULAR | Status: AC
  Filled 2021-08-10: qty 1

## 2021-08-10 MED ORDER — METOCLOPRAMIDE HCL 5 MG/ML IJ SOLN
INTRAMUSCULAR | Status: AC
  Filled 2021-08-10: qty 2

## 2021-08-10 NOTE — ED Triage Notes (Signed)
Pt presents with HA and dizziness x2d. Pt states he has a hx of migraines.
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
## 2021-08-10 NOTE — Discharge Instructions (Addendum)
You can take Benadryl when you get home.  Try to rest in a cool dark room.  You may also take Tylenol and/or ibuprofen as needed for pain relief and fever reduction. Make sure you are drinking plenty of fluids.   Your headache and dizziness do not improve or get worse please go to the emergency room for further evaluation.

## 2022-01-19 ENCOUNTER — Emergency Department (HOSPITAL_BASED_OUTPATIENT_CLINIC_OR_DEPARTMENT_OTHER): Payer: Self-pay

## 2022-01-19 ENCOUNTER — Encounter (HOSPITAL_BASED_OUTPATIENT_CLINIC_OR_DEPARTMENT_OTHER): Payer: Self-pay

## 2022-01-19 ENCOUNTER — Other Ambulatory Visit: Payer: Self-pay

## 2022-01-19 ENCOUNTER — Emergency Department (HOSPITAL_BASED_OUTPATIENT_CLINIC_OR_DEPARTMENT_OTHER)
Admission: EM | Admit: 2022-01-19 | Discharge: 2022-01-19 | Disposition: A | Discharge status: Home / Self Care | Payer: Self-pay | Attending: Emergency Medicine | Admitting: Emergency Medicine

## 2022-01-19 DIAGNOSIS — M79645 Pain in left finger(s): Secondary | ICD-10-CM | POA: Insufficient documentation

## 2022-01-19 DIAGNOSIS — W228XXA Striking against or struck by other objects, initial encounter: Secondary | ICD-10-CM | POA: Insufficient documentation

## 2022-01-19 DIAGNOSIS — Z5321 Procedure and treatment not carried out due to patient leaving prior to being seen by health care provider: Secondary | ICD-10-CM | POA: Insufficient documentation

## 2022-01-19 NOTE — ED Triage Notes (Signed)
Pt arrives with c/o left inky finger pain for about 2 days. Per pt, he hit his finger on the wall.  ?

## 2022-04-29 ENCOUNTER — Other Ambulatory Visit: Payer: Self-pay

## 2022-04-29 ENCOUNTER — Emergency Department (HOSPITAL_BASED_OUTPATIENT_CLINIC_OR_DEPARTMENT_OTHER)
Admission: EM | Admit: 2022-04-29 | Discharge: 2022-04-29 | Discharge status: Against Medical Advice | Payer: Self-pay | Attending: Emergency Medicine | Admitting: Emergency Medicine

## 2022-04-29 ENCOUNTER — Encounter (HOSPITAL_BASED_OUTPATIENT_CLINIC_OR_DEPARTMENT_OTHER): Payer: Self-pay | Admitting: Emergency Medicine

## 2022-04-29 DIAGNOSIS — R519 Headache, unspecified: Secondary | ICD-10-CM | POA: Insufficient documentation

## 2022-04-29 DIAGNOSIS — Z5321 Procedure and treatment not carried out due to patient leaving prior to being seen by health care provider: Secondary | ICD-10-CM | POA: Insufficient documentation

## 2022-04-29 DIAGNOSIS — R6883 Chills (without fever): Secondary | ICD-10-CM | POA: Insufficient documentation

## 2022-04-29 DIAGNOSIS — Z20822 Contact with and (suspected) exposure to covid-19: Secondary | ICD-10-CM | POA: Insufficient documentation

## 2022-04-29 DIAGNOSIS — J029 Acute pharyngitis, unspecified: Secondary | ICD-10-CM | POA: Insufficient documentation

## 2022-04-29 LAB — RESP PANEL BY RT-PCR (FLU A&B, COVID) ARPGX2
Influenza A by PCR: NEGATIVE
Influenza B by PCR: NEGATIVE
SARS Coronavirus 2 by RT PCR: NEGATIVE

## 2022-04-29 NOTE — ED Triage Notes (Signed)
Pt arrives to ED with c/o body aches, sore throat, headache, and chills since yesterday.

## 2022-09-25 ENCOUNTER — Emergency Department (HOSPITAL_BASED_OUTPATIENT_CLINIC_OR_DEPARTMENT_OTHER)
Admission: EM | Admit: 2022-09-25 | Discharge: 2022-09-25 | Disposition: A | Discharge status: Home / Self Care | Payer: Self-pay | Attending: Emergency Medicine | Admitting: Emergency Medicine

## 2022-09-25 ENCOUNTER — Other Ambulatory Visit: Payer: Self-pay

## 2022-09-25 ENCOUNTER — Encounter (HOSPITAL_BASED_OUTPATIENT_CLINIC_OR_DEPARTMENT_OTHER): Payer: Self-pay | Admitting: *Deleted

## 2022-09-25 DIAGNOSIS — K029 Dental caries, unspecified: Secondary | ICD-10-CM | POA: Insufficient documentation

## 2022-09-25 DIAGNOSIS — K0889 Other specified disorders of teeth and supporting structures: Secondary | ICD-10-CM

## 2022-09-25 DIAGNOSIS — K0381 Cracked tooth: Secondary | ICD-10-CM | POA: Insufficient documentation

## 2022-09-25 MED ORDER — AMOXICILLIN 500 MG PO CAPS
1000.0000 mg | ORAL_CAPSULE | Freq: Once | ORAL | Status: AC
  Administered 2022-09-25: 1000 mg via ORAL
  Filled 2022-09-25: qty 2

## 2022-09-25 MED ORDER — IBUPROFEN 800 MG PO TABS
800.0000 mg | ORAL_TABLET | Freq: Once | ORAL | Status: AC
  Administered 2022-09-25: 800 mg via ORAL
  Filled 2022-09-25: qty 1

## 2022-09-25 MED ORDER — AMOXICILLIN 500 MG PO CAPS: 1000.0000 mg | ORAL_CAPSULE | Freq: Two times a day (BID) | ORAL | 0 refills | Status: DC

## 2022-09-25 MED ORDER — IBUPROFEN 800 MG PO TABS: 800.0000 mg | ORAL_TABLET | Freq: Four times a day (QID) | ORAL | 0 refills | Status: DC | PRN

## 2022-09-25 MED ORDER — HYDROCODONE-ACETAMINOPHEN 5-325 MG PO TABS
1.0000 | ORAL_TABLET | Freq: Once | ORAL | Status: AC
  Administered 2022-09-25: 1 via ORAL
  Filled 2022-09-25: qty 1

## 2022-09-25 NOTE — ED Triage Notes (Signed)
C/o left upper tooth pain that started 2 days ago. States he has not taken anything for the pain. Does not have a dentist. States that his tooth is broken. Denies fevers.

## 2022-09-25 NOTE — ED Provider Notes (Signed)
[facility] EMERGENCY DEPT Provider Note   CSN: [id number] Arrival date & time: 09/25/22  0102     History  Chief Complaint  Patient presents with   Dental Pain    Eric Valencia is a 31 y.o. male.  Patient presents to the emergency department for evaluation of dental pain.  Symptoms began 2 days ago.  He reports moderate to severe pain without facial swelling or fever.       Home Medications Prior to Admission medications   Medication Sig Start Date End Date Taking? Authorizing Provider  amoxicillin (AMOXIL) 500 MG capsule Take 2 capsules (1,000 mg total) by mouth 2 (two) times daily. 09/25/22  Yes Kamila Broda, Canary Brim, MD  ibuprofen (ADVIL) 800 MG tablet Take 1 tablet (800 mg total) by mouth every 6 (six) hours as needed for moderate pain. 09/25/22  Yes Paddy Walthall, Canary Brim, MD  albuterol (PROVENTIL HFA;VENTOLIN HFA) 108 (90 BASE) MCG/ACT inhaler Inhale 1 puff into the lungs every 6 (six) hours as needed for wheezing or shortness of breath. Patient not taking: Reported on 08/10/2021    [provider]      Allergies    Patient has no known allergies.    Review of Systems   Review of Systems  Physical Exam Updated Vital Signs BP (!) 163/100 (BP Location: Left Arm)   Pulse 71   Temp 98.3 F (36.8 C)   Resp 18   Ht 5\' 5"  (1.651 m)   Wt 79.4 kg   SpO2 100%   BMI 29.12 kg/m  Physical Exam Vitals and nursing note reviewed.  Constitutional:      General: He is not in acute distress.    Appearance: He is well-developed.  HENT:     Head: Normocephalic and atraumatic.     Mouth/Throat:     Mouth: Mucous membranes are moist.   Eyes:     General: Vision grossly intact. Gaze aligned appropriately.     Extraocular Movements: Extraocular movements intact.     Conjunctiva/sclera: Conjunctivae normal.  Cardiovascular:     Rate and Rhythm: Normal rate and regular rhythm.     Pulses: Normal pulses.     Heart sounds: Normal heart  sounds, S1 normal and S2 normal. No murmur heard.    No friction rub. No gallop.  Pulmonary:     Effort: Pulmonary effort is normal. No respiratory distress.     Breath sounds: Normal breath sounds.  Abdominal:     Palpations: Abdomen is soft.     Tenderness: There is no abdominal tenderness. There is no guarding or rebound.     Hernia: No hernia is present.  Musculoskeletal:        General: No swelling.     Cervical back: Full passive range of motion without pain, normal range of motion and neck supple. No pain with movement, spinous process tenderness or muscular tenderness. Normal range of motion.     Right lower leg: No edema.     Left lower leg: No edema.  Skin:    General: Skin is warm and dry.     Capillary Refill: Capillary refill takes less than 2 seconds.     Findings: No ecchymosis, erythema, lesion or wound.  Neurological:     Mental Status: He is alert and oriented to person, place, and time.     GCS: GCS eye subscore is 4. GCS verbal subscore is 5. GCS motor subscore is 6.     Cranial Nerves: Cranial nerves 2-12  are intact.     Sensory: Sensation is intact.     Motor: Motor function is intact. No weakness or abnormal muscle tone.     Coordination: Coordination is intact.  Psychiatric:        Mood and Affect: Mood normal.        Speech: Speech normal.        Behavior: Behavior normal.     ED Results / Procedures / Treatments   Labs (all labs ordered are listed, but only abnormal results are displayed) Labs Reviewed - No data to display  EKG None  Radiology No results found.  Procedures Procedures    Medications Ordered in ED Medications  amoxicillin (AMOXIL) capsule 1,000 mg (has no administration in time range)  HYDROcodone-acetaminophen (NORCO/VICODIN) 5-325 MG per tablet 1 tablet (has no administration in time range)  ibuprofen (ADVIL) tablet 800 mg (has no administration in time range)    ED Course/ Medical Decision Making/ A&P                            Medical Decision Making  Presents with dental pain ongoing for 2 days.  Patient with multiple caries and fractured teeth, chronic in nature.  No fluctuance or sign of dental abscess.        Final Clinical Impression(s) / ED Diagnoses Final diagnoses:  Dentalgia    Rx / DC Orders ED Discharge Orders          Ordered    amoxicillin (AMOXIL) 500 MG capsule  2 times daily        09/25/22 0343    ibuprofen (ADVIL) 800 MG tablet  Every 6 hours PRN        09/25/22 0343              Gilda Crease, MD 09/25/22 [phone]

## 2023-03-09 ENCOUNTER — Encounter (HOSPITAL_COMMUNITY): Payer: Self-pay | Admitting: *Deleted

## 2023-03-09 ENCOUNTER — Ambulatory Visit (HOSPITAL_COMMUNITY)
Admission: EM | Admit: 2023-03-09 | Discharge: 2023-03-09 | Disposition: A | Discharge status: Home / Self Care | Payer: Self-pay | Attending: Family Medicine | Admitting: Family Medicine

## 2023-03-09 ENCOUNTER — Other Ambulatory Visit: Payer: Self-pay

## 2023-03-09 DIAGNOSIS — M5442 Lumbago with sciatica, left side: Secondary | ICD-10-CM

## 2023-03-09 DIAGNOSIS — M5441 Lumbago with sciatica, right side: Secondary | ICD-10-CM

## 2023-03-09 MED ORDER — TIZANIDINE HCL 4 MG PO TABS: 4.0000 mg | ORAL_TABLET | Freq: Three times a day (TID) | ORAL | 0 refills | Status: DC | PRN

## 2023-03-09 MED ORDER — KETOROLAC TROMETHAMINE 30 MG/ML IJ SOLN
30.0000 mg | Freq: Once | INTRAMUSCULAR | Status: AC
  Administered 2023-03-09: 30 mg via INTRAMUSCULAR

## 2023-03-09 MED ORDER — KETOROLAC TROMETHAMINE 30 MG/ML IJ SOLN
INTRAMUSCULAR | Status: AC
  Filled 2023-03-09: qty 1

## 2023-03-09 MED ORDER — IBUPROFEN 800 MG PO TABS: 800.0000 mg | ORAL_TABLET | Freq: Three times a day (TID) | ORAL | 0 refills | Status: DC | PRN

## 2023-03-09 NOTE — Discharge Instructions (Signed)
You have been given a shot of Toradol 30 mg today.  Take ibuprofen 800 mg--1 tab every 8 hours as needed for pain.   Take tizanidine 4 mg--1 every 8 hours as needed for muscle spasms; this medication can cause dizziness and sleepiness  If you worsen in any way or if you are not improving with the medications prescribed, then please proceed to the emergency room for further evaluation

## 2023-03-09 NOTE — ED Triage Notes (Signed)
Pt reports lower back pain that radiates to bil. Legs fro 3 days. Pt does not remember any injury.
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
## 2023-04-30 ENCOUNTER — Emergency Department (HOSPITAL_BASED_OUTPATIENT_CLINIC_OR_DEPARTMENT_OTHER)
Admission: EM | Admit: 2023-04-30 | Discharge: 2023-04-30 | Disposition: A | Discharge status: Home / Self Care | Payer: Self-pay | Attending: Emergency Medicine | Admitting: Emergency Medicine

## 2023-04-30 ENCOUNTER — Other Ambulatory Visit: Payer: Self-pay

## 2023-04-30 ENCOUNTER — Other Ambulatory Visit (HOSPITAL_BASED_OUTPATIENT_CLINIC_OR_DEPARTMENT_OTHER): Payer: Self-pay

## 2023-04-30 ENCOUNTER — Emergency Department (HOSPITAL_BASED_OUTPATIENT_CLINIC_OR_DEPARTMENT_OTHER): Payer: Self-pay | Admitting: Radiology

## 2023-04-30 DIAGNOSIS — M545 Low back pain, unspecified: Secondary | ICD-10-CM | POA: Insufficient documentation

## 2023-04-30 DIAGNOSIS — G8929 Other chronic pain: Secondary | ICD-10-CM | POA: Insufficient documentation

## 2023-04-30 MED ORDER — CYCLOBENZAPRINE HCL 10 MG PO TABS
10.0000 mg | ORAL_TABLET | Freq: Two times a day (BID) | ORAL | 0 refills | Status: DC | PRN
  Filled 2023-04-30: qty 20, 10d supply, fill #0

## 2023-04-30 MED ORDER — OXYCODONE-ACETAMINOPHEN 5-325 MG PO TABS
2.0000 | ORAL_TABLET | Freq: Once | ORAL | Status: AC
  Administered 2023-04-30: 2 via ORAL
  Filled 2023-04-30: qty 2

## 2023-04-30 NOTE — ED Provider Notes (Signed)
[place] EMERGENCY DEPARTMENT AT [facility] Provider Note   CSN: [id number] Arrival date & time: 04/30/23  0759     History  Chief Complaint  Patient presents with   Back Pain    Eric Valencia is a 32 y.o. male.  HPI 32 yo male complaining of low back pain began after fall 3 months ago.  States he has been seen 2 times without change.  Pain waxes and wanes, no definite increasing or easing factors. No loss of bowel or bladder control No fever or ivda    Home Medications Prior to Admission medications   Medication Sig Start Date End Date Taking? Authorizing Provider  cyclobenzaprine (FLEXERIL) 10 MG tablet Take 1 tablet (10 mg total) by mouth 2 (two) times daily as needed for muscle spasms. 04/30/23  Yes Margarita Grizzle, MD  albuterol (PROVENTIL HFA;VENTOLIN HFA) 108 (90 BASE) MCG/ACT inhaler Inhale 1 puff into the lungs every 6 (six) hours as needed for wheezing or shortness of breath. Patient not taking: Reported on 08/10/2021    [provider]  ibuprofen (ADVIL) 800 MG tablet Take 1 tablet (800 mg total) by mouth every 8 (eight) hours as needed (pain). 03/09/23   Zenia Resides, MD  tiZANidine (ZANAFLEX) 4 MG tablet Take 1 tablet (4 mg total) by mouth every 8 (eight) hours as needed for muscle spasms. 03/09/23   Zenia Resides, MD      Allergies    Patient has no known allergies.    Review of Systems   Review of Systems  Physical Exam Updated Vital Signs BP 138/89   Pulse 88   Temp 97.8 F (36.6 C) (Oral)   Resp 19   Ht 1.524 m (5')   Wt 72.6 kg   SpO2 98%   BMI 31.25 kg/m  Physical Exam Vitals and nursing note reviewed.  Constitutional:      Appearance: He is well-developed.  HENT:     Head: Normocephalic and atraumatic.     Right Ear: External ear normal.     Left Ear: External ear normal.     Nose: Nose normal.  Eyes:     Extraocular Movements: Extraocular movements intact.  Neck:     Trachea: No tracheal deviation.   Pulmonary:     Effort: Pulmonary effort is normal.  Musculoskeletal:        General: Normal range of motion.     Comments: Patient refuses palpation of lower back No obvious trauma on visual inspection- no swelling  Skin:    General: Skin is warm and dry.     Capillary Refill: Capillary refill takes less than 2 seconds.  Neurological:     Mental Status: He is alert and oriented to person, place, and time.  Psychiatric:        Mood and Affect: Mood normal.        Behavior: Behavior normal.     ED Results / Procedures / Treatments   Labs (all labs ordered are listed, but only abnormal results are displayed) Labs Reviewed - No data to display  EKG None  Radiology DG Lumbar Spine 2-3 Views  Result Date: 04/30/2023 CLINICAL DATA:  pain after fall 3 months ago EXAM: LUMBAR SPINE - 2-3 VIEW COMPARISON:  09/11/2017. FINDINGS: There are 5 nonrib-bearing lumbar vertebrae. There is loss of lumbar lordosis, which may be on the basis of positioning or due to muscle spasm.No spondylolysis or spondylolisthesis. Vertebral body heights are maintained. No aggressive osseous lesion. Intervertebral disc  heights are maintained. No significant facet arthropathy or marginal osteophyte formation. Sacroiliac joints are symmetric. Visualized soft tissues are within normal limits. IMPRESSION: *Loss of lumbar lordosis, which may be on the basis of positioning or due to muscle spasm. *Otherwise unremarkable exam. Electronically Signed   By: Jules Schick M.D.   On: 04/30/2023 08:44    Procedures Procedures    Medications Ordered in ED Medications  oxyCODONE-acetaminophen (PERCOCET/ROXICET) 5-325 MG per tablet 2 tablet (2 tablets Oral Given 04/30/23 0843)    ED Course/ Medical Decision Making/ A&P Clinical Course as of 04/30/23 0905  Thu Apr 30, 2023  0900 Lumbar spine x-rays obtained reviewed and interpreted there is acute abnormality and radiologist interpretation concurs [DR]    Clinical Course  User Index [DR] Margarita Grizzle, MD                             Medical Decision Making Amount and/or Complexity of Data Reviewed Radiology: ordered.  Risk Prescription drug management.  32 year old male presents today complaining of low back pain.  He had an injury several months ago and identifies this as the starting point.  As no imaging was obtained at that time, plain imaging was obtained today.  There is no evidence of acute fracture.  Patient was treated here with 2 Percocet Differential diagnosis includes but is not limited to lumbar fracture, disc disease, abscess and other space-occupying lesions, urinary tract infection, musculoskeletal etiology of back pain. Patient does not have any red flag symptoms of radiation, loss of bowel or bladder control, loss of strength, numbness or tingling. I have a low index of suspicion of abscess or other space-occupying lesions.  Patient clinically appears well with no evidence of swelling, redness, and denies any history of IV drug abuse. Patient treated here with 2 Percocet he has been taking acetaminophen at home.  Will be advised regarding nonsteroidal anti-inflammatories and muscle relaxants.         Final Clinical Impression(s) / ED Diagnoses Final diagnoses:  Chronic midline low back pain without sciatica    Rx / DC Orders ED Discharge Orders          Ordered    cyclobenzaprine (FLEXERIL) 10 MG tablet  2 times daily PRN        04/30/23 0902              Margarita Grizzle, MD 04/30/23 [phone]

## 2023-04-30 NOTE — ED Notes (Signed)
Heat packs applied to lower back

## 2023-04-30 NOTE — Discharge Instructions (Signed)
Please use ibuprofen 800 mg every 6 hours for the next 3 days Please use the Flexeril as prescribed Use warm and cold therapy and gentle movement. Turn to the emergency department if you are having any numbness, tingling, loss of bowel or bladder control or new weakness.

## 2023-04-30 NOTE — ED Notes (Signed)
Pt returned from [facility].

## 2023-04-30 NOTE — ED Triage Notes (Signed)
Pt arrived via POV with lower mid back pain that started last night. C/o back pain for a couple of months, with occassional tingling in legs. Previous fall at work and hit back on steps.

## 2023-04-30 NOTE — ED Notes (Signed)
Patient transported to X-ray 

## 2023-05-03 IMAGING — CT CT HEAD W/O CM
4 series · 16 of 47 positions shown, 18 images · non-contrast
Comparison: None.

CLINICAL DATA: Headache, chronic, new features or increased
frequency

EXAM:
CT HEAD WITHOUT CONTRAST
TECHNIQUE: Contiguous axial images were obtained from the base of the skull
through the vertex without intravenous contrast.

[Series 3: head without · axial · non-contrast · 0.45mm/px · z∈[-172,-52]mm · 7 of 32 slices shown, 9 images]
[im 4/32  brain]
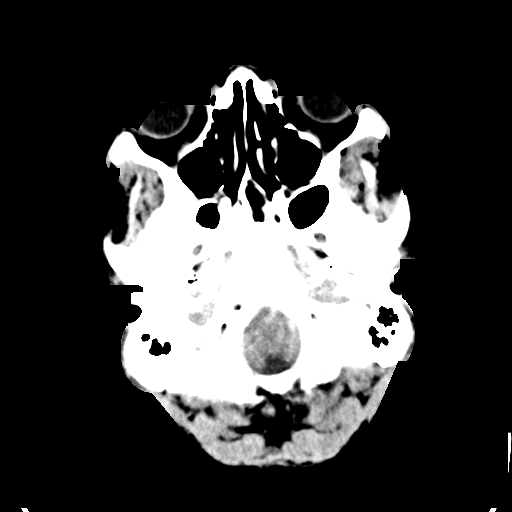
[im 4/32  bone]
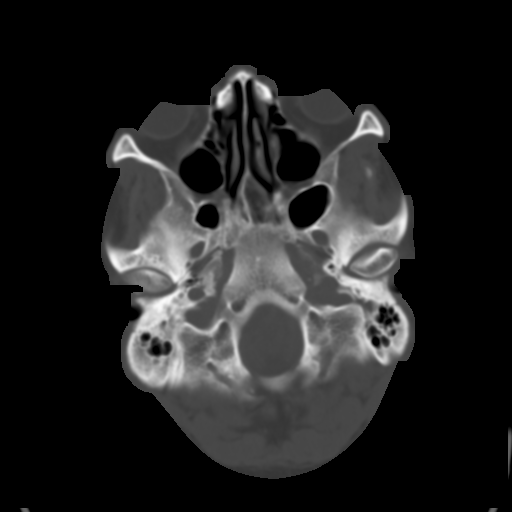
[im 8/32  brain]
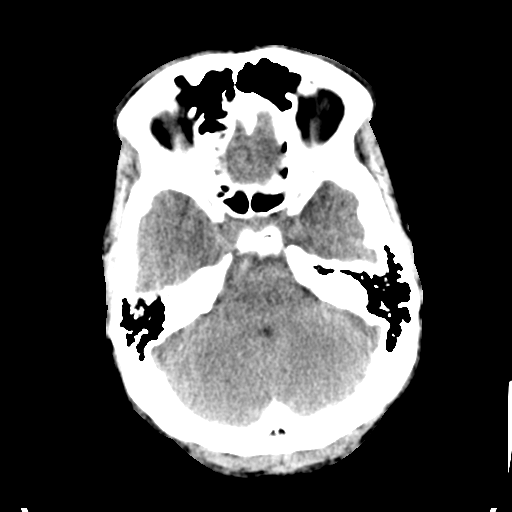
[im 12/32  brain]
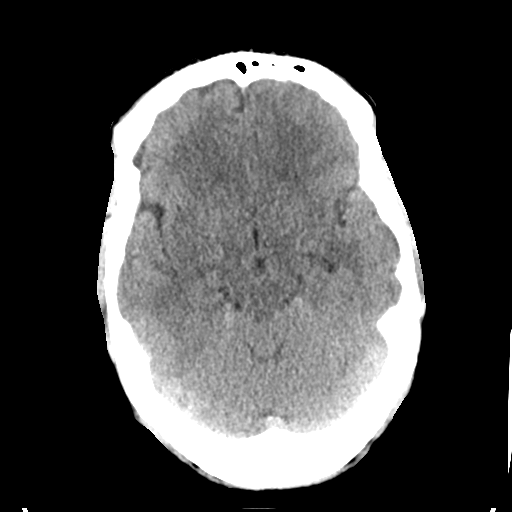
[im 16/32  brain]
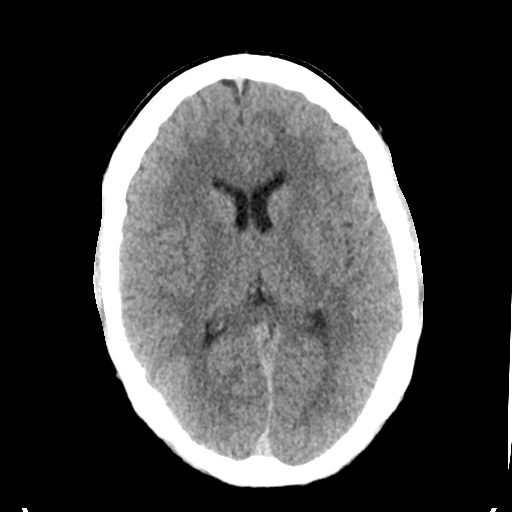
[im 20/32  brain]
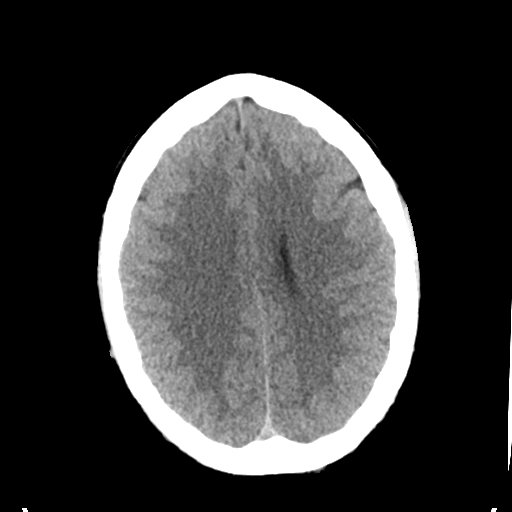
[im 20/32  bone]
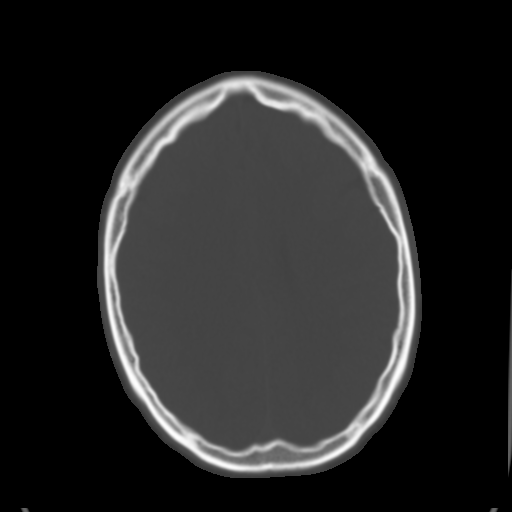
[im 24/32  brain]
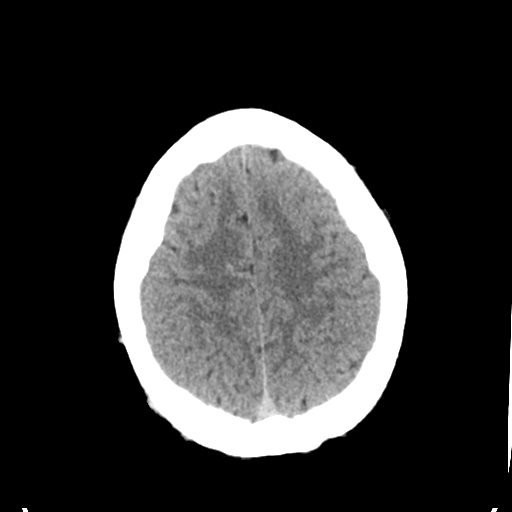
[im 28/32  brain]
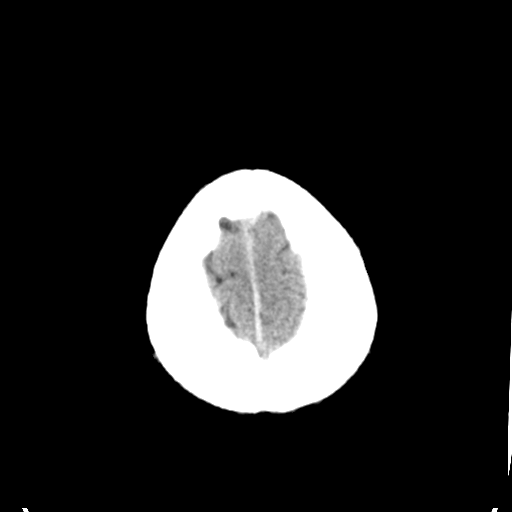

[Series 4: head bone · axial · 0.45mm/px · z∈[-172,-140]mm · 3 of 80 slices shown]
[im 8/80  bone]
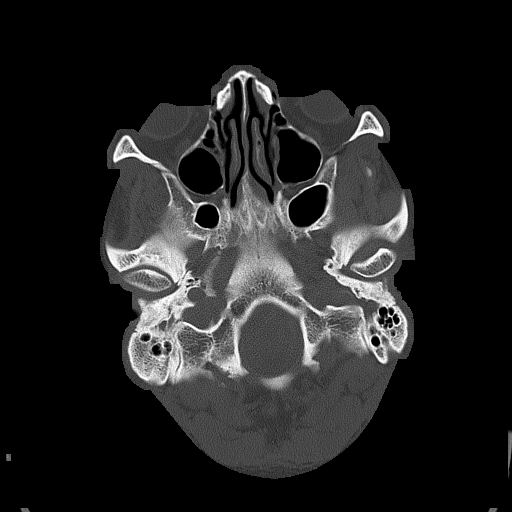
[im 16/80  bone]
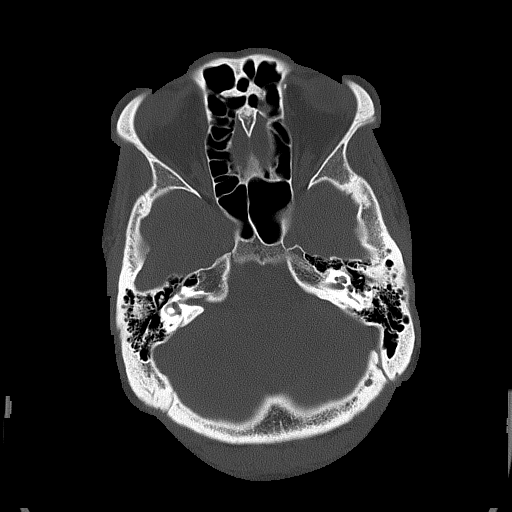
[im 24/80  bone]
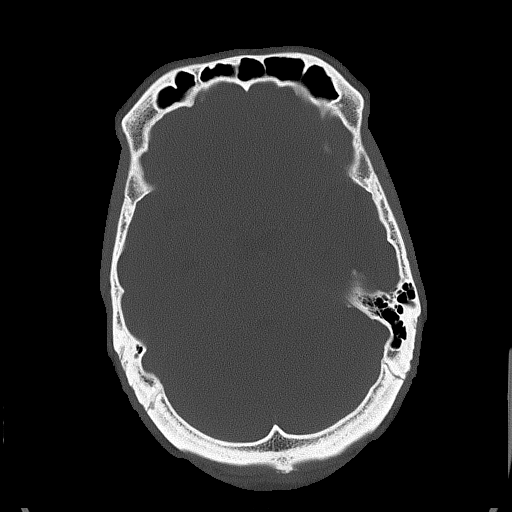

[Series 5: head without cor · coronal · non-contrast · 0.37mm/px · 3 of 72 slices shown]
[im 24/72  brain]
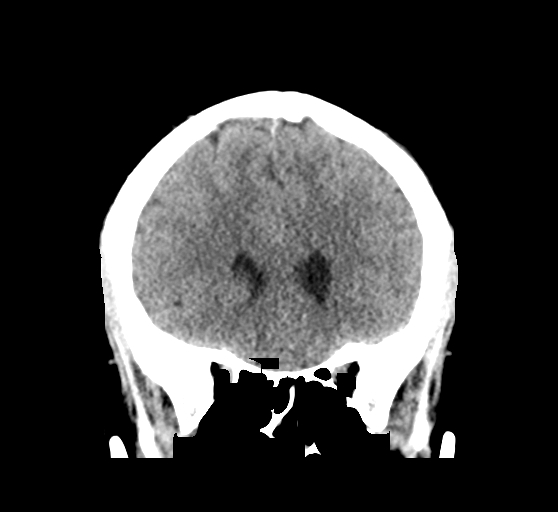
[im 32/72  brain]
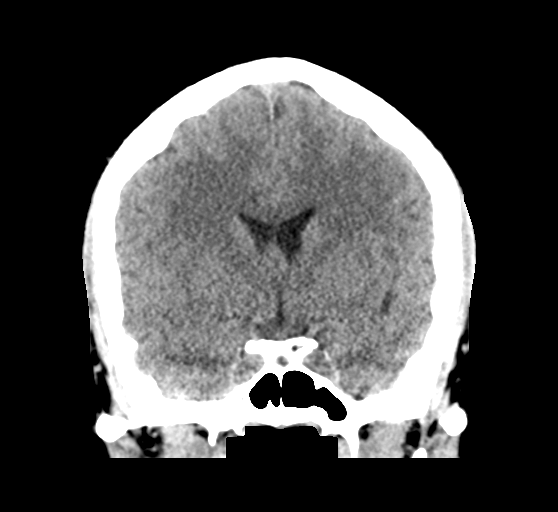
[im 40/72  brain]
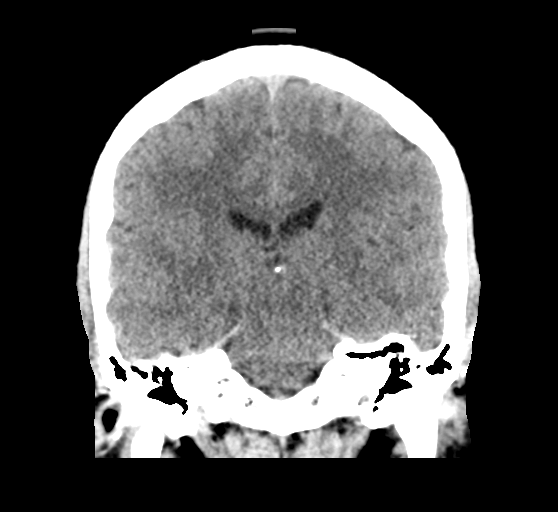

[Series 6: head without sag · sagittal · non-contrast · 0.34mm/px · 3 of 67 slices shown]
[im 23/67  brain]
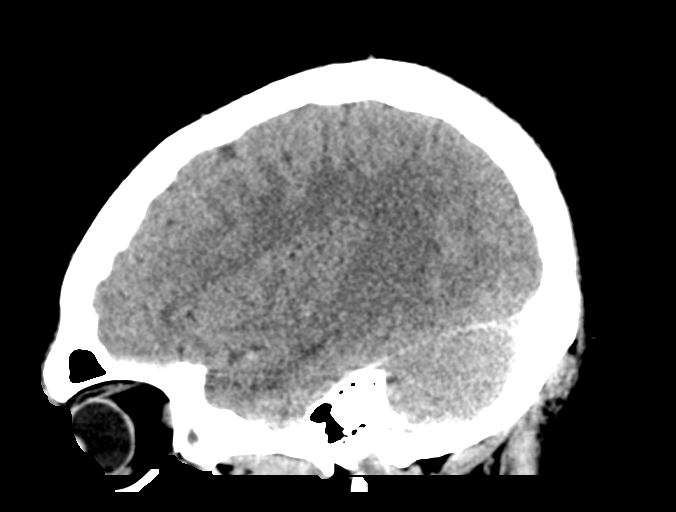
[im 34/67  brain]
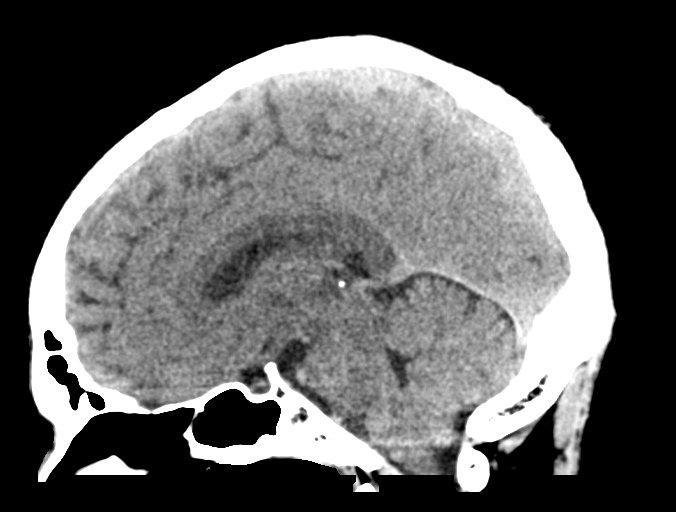
[im 45/67  brain]
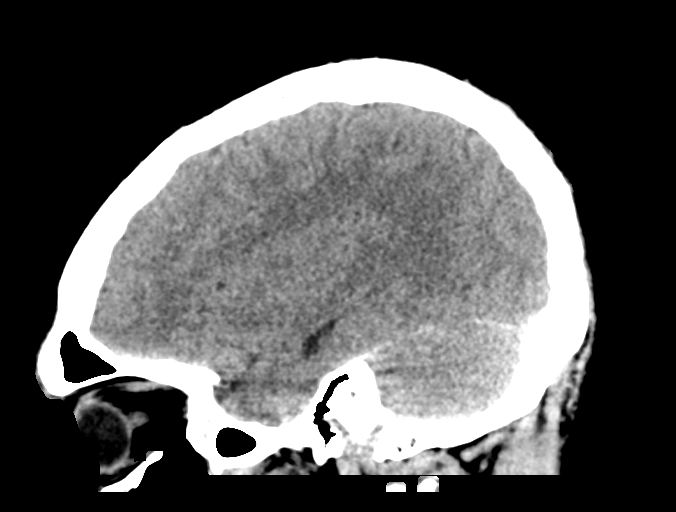

[16 of 47 positions shown; findings below may reference images not displayed]

FINDINGS: Brain: No evidence of acute infarction, hemorrhage, hydrocephalus,
extra-axial collection or mass lesion/mass effect.The ventricles are
normal in size.

Vascular: No hyperdense vessel or unexpected calcification.

Skull: Scattered minimal paranasal sinus mucosal thickening.

Sinuses/Orbits: No acute finding.

Other: None.
IMPRESSION: No acute intracranial abnormality.

## 2023-05-03 IMAGING — CR DG CHEST 2V
2 series · 2 of 2 positions shown · non-contrast
Comparison: Chest radiograph, 04/24/2021.  CT chest, 11/25/2015

CLINICAL DATA: 29-year-old male with chest pain.

EXAM:
CHEST - 2 VIEW

[chest pa]
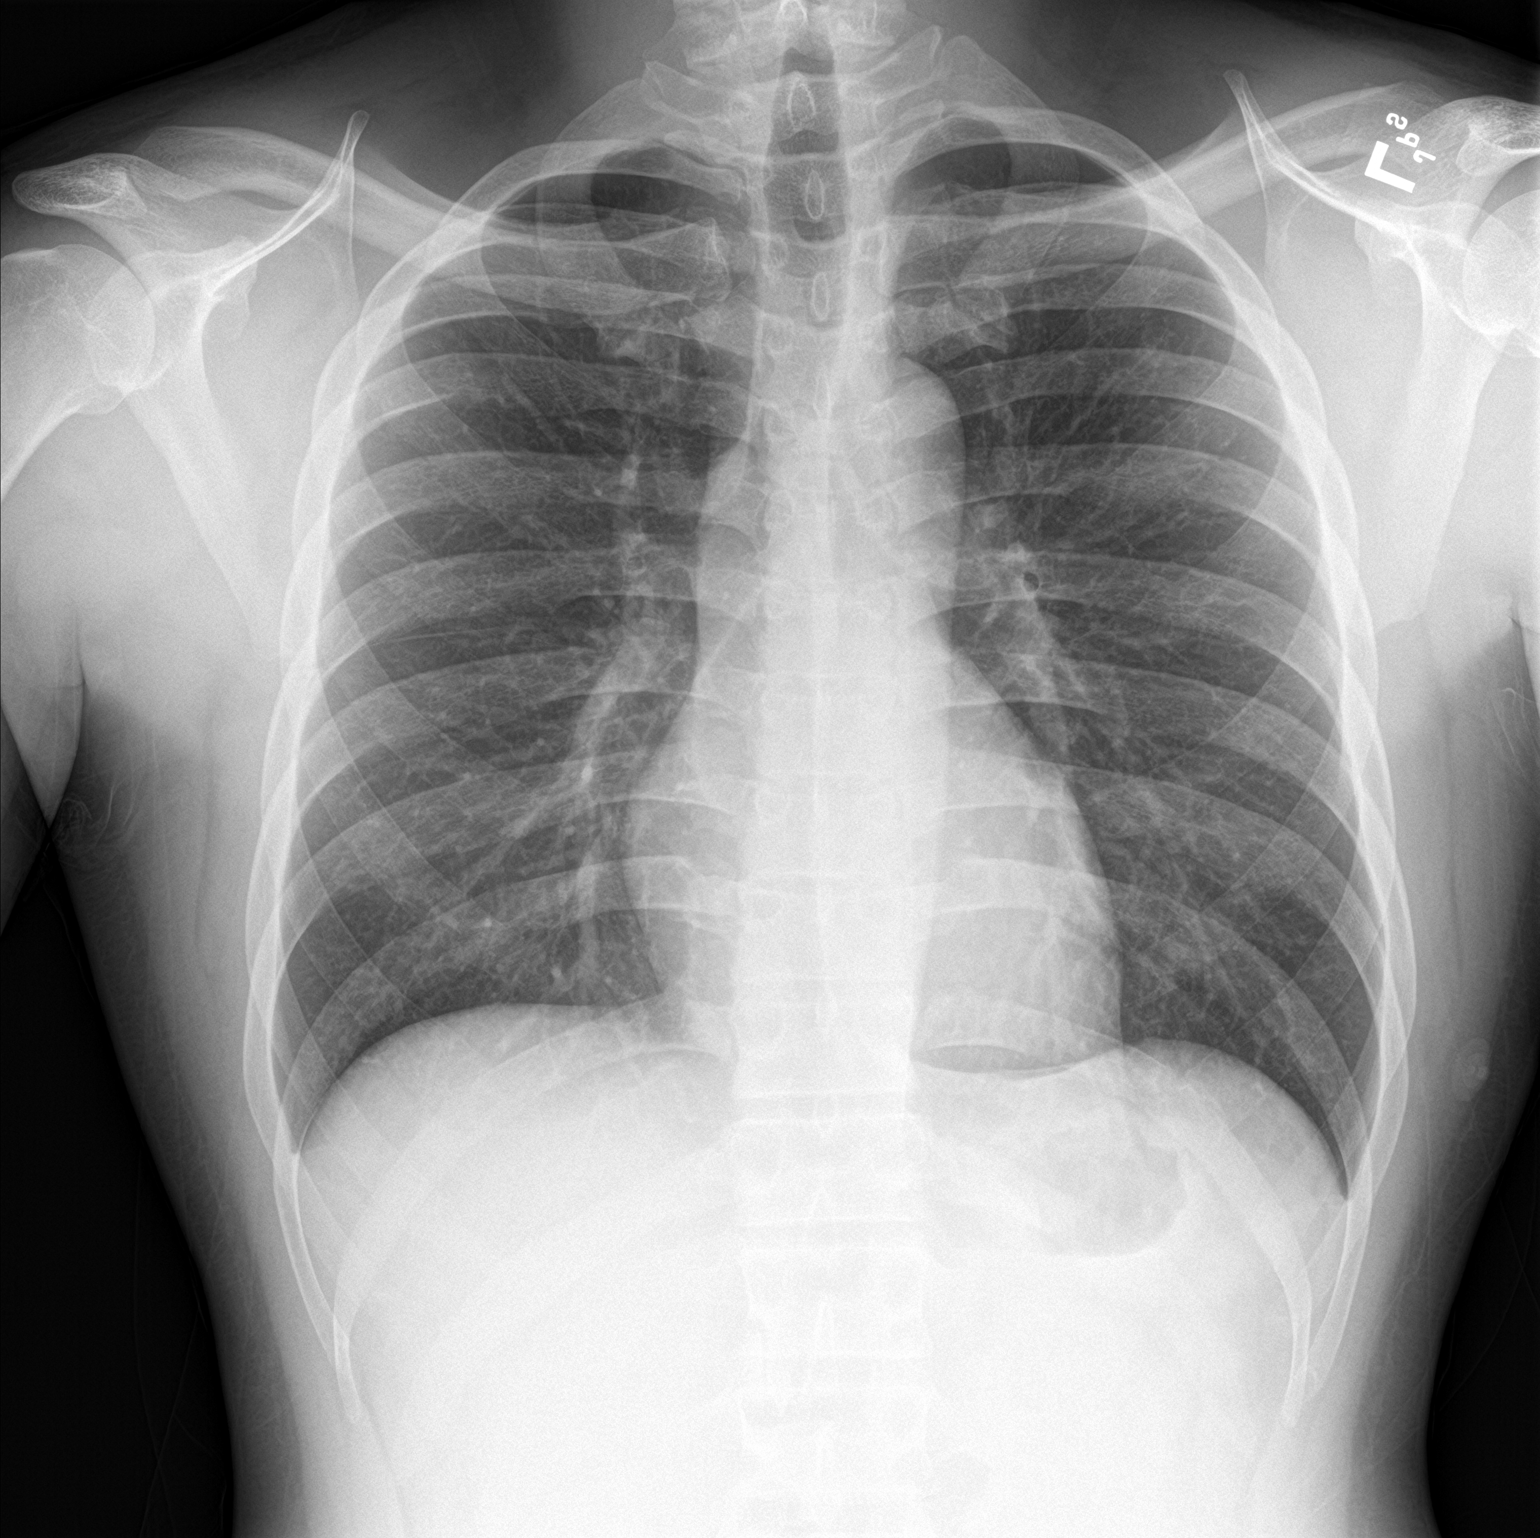

[chest lat]
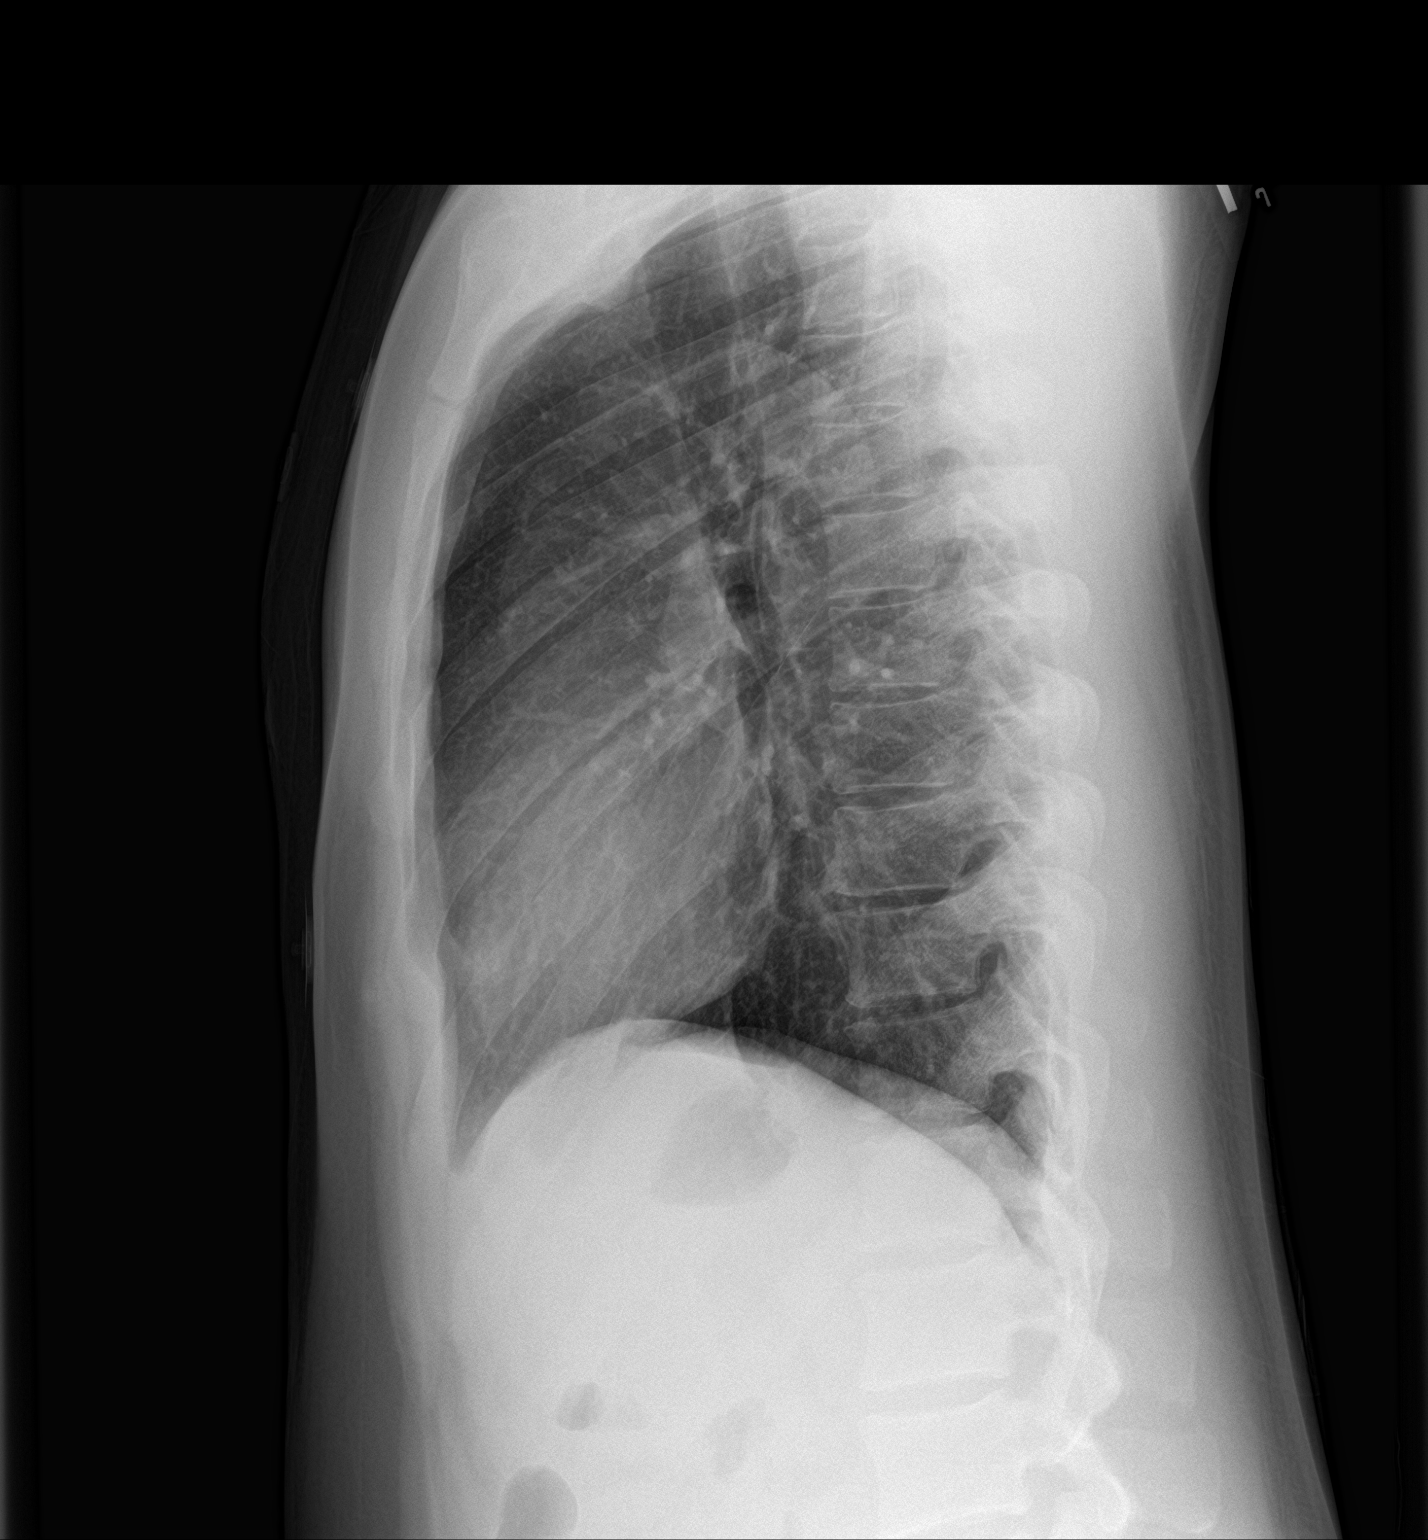

[2 of 2 positions shown; findings below may reference images not displayed]

FINDINGS: Cardiomediastinal silhouette is within normal limits. The lungs are
well inflated. No focal consolidation or mass. No pleural effusion
pneumothorax. No acute osseous abnormality
IMPRESSION: Normal chest.

## 2023-05-04 ENCOUNTER — Ambulatory Visit (HOSPITAL_COMMUNITY)
Admission: EM | Admit: 2023-05-04 | Discharge: 2023-05-04 | Disposition: A | Discharge status: Home / Self Care | Payer: Self-pay | Attending: Emergency Medicine | Admitting: Emergency Medicine

## 2023-05-04 ENCOUNTER — Encounter (HOSPITAL_COMMUNITY): Payer: Self-pay

## 2023-05-04 DIAGNOSIS — K047 Periapical abscess without sinus: Secondary | ICD-10-CM

## 2023-05-04 DIAGNOSIS — K029 Dental caries, unspecified: Secondary | ICD-10-CM

## 2023-05-04 MED ORDER — CEFTRIAXONE SODIUM 1 G IJ SOLR
1.0000 g | Freq: Once | INTRAMUSCULAR | Status: AC
  Administered 2023-05-04: 1 g via INTRAMUSCULAR

## 2023-05-04 MED ORDER — PENICILLIN V POTASSIUM 500 MG PO TABS: 500.0000 mg | ORAL_TABLET | Freq: Three times a day (TID) | ORAL | 0 refills | Status: AC

## 2023-05-04 MED ORDER — KETOROLAC TROMETHAMINE 30 MG/ML IJ SOLN
INTRAMUSCULAR | Status: AC
  Filled 2023-05-04: qty 1

## 2023-05-04 MED ORDER — KETOROLAC TROMETHAMINE 30 MG/ML IJ SOLN
30.0000 mg | Freq: Once | INTRAMUSCULAR | Status: AC
  Administered 2023-05-04: 30 mg via INTRAMUSCULAR

## 2023-05-04 MED ORDER — CEFTRIAXONE SODIUM 1 G IJ SOLR
INTRAMUSCULAR | Status: AC
  Filled 2023-05-04: qty 10

## 2023-05-04 MED ORDER — LIDOCAINE HCL (PF) 1 % IJ SOLN
INTRAMUSCULAR | Status: AC
  Filled 2023-05-04: qty 2

## 2023-05-04 NOTE — Discharge Instructions (Addendum)
You received an injection of high-dose anti-inflammatory pain medication and a high-dose antibiotic during her visit today.  You should begin to feel better in a few hours.  It is very important that you go to the pharmacy now to pick up your prescription for penicillin and begin taking it this evening.  You will take this tablet 3 times daily for the next 14 days or until you are seen by a dentist.  It is even more important that you call to schedule a dental appointment tomorrow to have these infected cavities addressed.  Antibiotics only temporarily solve this problem for you.  If you do not have your damaged teeth addressed, the infection will just come back repeatedly until you do.  I have enclosed a list of low-cost dentists in the area just in case you do not have a dentist at this time.  I hope this is helpful.  Thank you for visiting [facility] today.  I hope you feel better soon.
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
## 2023-05-04 NOTE — ED Triage Notes (Signed)
Patient here today with c/o right side dental pain since last night. Has not taken anything for the pain.

## 2023-05-05 ENCOUNTER — Other Ambulatory Visit (HOSPITAL_BASED_OUTPATIENT_CLINIC_OR_DEPARTMENT_OTHER): Payer: Self-pay

## 2023-11-18 ENCOUNTER — Emergency Department (HOSPITAL_COMMUNITY)
Admission: EM | Admit: 2023-11-18 | Discharge: 2023-11-18 | Discharge status: Against Medical Advice | Payer: Self-pay | Attending: Emergency Medicine | Admitting: Emergency Medicine

## 2023-11-18 ENCOUNTER — Other Ambulatory Visit: Payer: Self-pay

## 2023-11-18 ENCOUNTER — Encounter (HOSPITAL_COMMUNITY): Payer: Self-pay | Admitting: *Deleted

## 2023-11-18 DIAGNOSIS — Z5321 Procedure and treatment not carried out due to patient leaving prior to being seen by health care provider: Secondary | ICD-10-CM | POA: Insufficient documentation

## 2023-11-18 DIAGNOSIS — N492 Inflammatory disorders of scrotum: Secondary | ICD-10-CM | POA: Insufficient documentation

## 2023-11-18 NOTE — ED Provider Triage Note (Signed)
 Emergency Medicine Provider Triage Evaluation Note  RYVER POBLETE , a 33 y.o. male  was evaluated in triage.  Pt complains of an abscess in scrotal area.  Pt reports increased pain and swelling  Review of Systems  Positive: pain Negative: fever  Physical Exam  BP (!) 140/94 (BP Location: Right Arm)   Pulse 98   Temp 98.6 F (37 C) (Oral)   Resp 18   SpO2 99%  Gen:   Awake, no distress   Resp:  Normal effort  MSK:   Moves extremities without difficulty  Other:  Swollen area scrotum   Medical Decision Making  Medically screening exam initiated at 10:13 AM.  Appropriate orders placed.  EVERARD INTERRANTE was informed that the remainder of the evaluation will be completed by another provider, this initial triage assessment does not replace that evaluation, and the importance of remaining in the ED until their evaluation is complete.  Pt will need room for further evaltuion   Flint Sonny POUR, PA-C 11/18/23 1015

## 2023-11-18 NOTE — ED Triage Notes (Signed)
 Pt is here with abscess to right inner buttocks are and is not draining. Pt was on antibiotics that he started taking that was for his tooth. Pt states he has had the abscess for almost a week and it just hurts too bad.

## 2023-11-18 NOTE — ED Notes (Signed)
 Pt refusing repeat VS. Pt states he is leaving the ED. Pt seen walking out of ED.

## 2023-11-19 ENCOUNTER — Encounter (HOSPITAL_BASED_OUTPATIENT_CLINIC_OR_DEPARTMENT_OTHER): Payer: Self-pay | Admitting: Emergency Medicine

## 2023-11-19 ENCOUNTER — Emergency Department (HOSPITAL_BASED_OUTPATIENT_CLINIC_OR_DEPARTMENT_OTHER)
Admission: EM | Admit: 2023-11-19 | Discharge: 2023-11-19 | Disposition: A | Discharge status: Home / Self Care | Payer: Self-pay | Attending: Emergency Medicine | Admitting: Emergency Medicine

## 2023-11-19 ENCOUNTER — Other Ambulatory Visit: Payer: Self-pay

## 2023-11-19 DIAGNOSIS — L0231 Cutaneous abscess of buttock: Secondary | ICD-10-CM | POA: Insufficient documentation

## 2023-11-19 MED ORDER — OXYCODONE-ACETAMINOPHEN 5-325 MG PO TABS: 1.0000 | ORAL_TABLET | Freq: Once | ORAL | Status: DC

## 2023-11-19 MED ORDER — DOXYCYCLINE HYCLATE 100 MG PO CAPS: 100.0000 mg | ORAL_CAPSULE | Freq: Two times a day (BID) | ORAL | 0 refills | Status: DC

## 2023-11-19 MED ORDER — LIDOCAINE HCL (PF) 1 % IJ SOLN
10.0000 mL | Freq: Once | INTRAMUSCULAR | Status: AC
  Administered 2023-11-19: 10 mL
  Filled 2023-11-19: qty 10

## 2023-11-19 NOTE — ED Triage Notes (Signed)
 Pt arrived from home with c/o a boil on his R lower buttock. Went to [facility] ED yesterday and LWBS. Pain 10/10

## 2023-11-19 NOTE — Discharge Instructions (Addendum)
 Please use Tylenol  or ibuprofen  for pain.  You may use 600 mg ibuprofen  every 6 hours or 1000 mg of Tylenol  every 6 hours.  You may choose to alternate between the 2.  This would be most effective.  Not to exceed 4 g of Tylenol  within 24 hours.  Not to exceed 3200 mg ibuprofen  24 hours.  Please complete your course of antibiotics as prescribed.  Please return worsening symptoms.  Again follow-up with a surgeon if you have persistent return of abscess in the same location.  He stated

## 2023-11-19 NOTE — ED Provider Notes (Signed)
 [place] EMERGENCY DEPARTMENT AT [facility] Provider Note   CSN: [id number] Arrival date & time: 11/19/23  1043     History  Chief Complaint  Patient presents with   Abscess    Eric Valencia is a 33 y.o. male with overall noncontributory past medical history presents with right lower buttock for around one week.  Endorses 10/10 pain. He denies any injury, denies any drainage. No previous history of same.   Abscess      Home Medications Prior to Admission medications   Medication Sig Start Date End Date Taking? Authorizing Provider  doxycycline  (VIBRAMYCIN ) 100 MG capsule Take 1 capsule (100 mg total) by mouth 2 (two) times daily. 11/19/23  Yes Kendyn Zaman H, PA-C      Allergies    Patient has no known allergies.    Review of Systems   Review of Systems  All other systems reviewed and are negative.   Physical Exam Updated Vital Signs BP (!) 132/101 (BP Location: Right Arm)   Pulse 90   Temp 98.8 F (37.1 C) (Oral)   Resp 16   Ht 5' 6 (1.676 m)   Wt 79.4 kg   SpO2 100%   BMI 28.25 kg/m  Physical Exam Vitals and nursing note reviewed.  Constitutional:      General: He is not in acute distress.    Appearance: Normal appearance.  HENT:     Head: Normocephalic and atraumatic.  Eyes:     General:        Right eye: No discharge.        Left eye: No discharge.  Cardiovascular:     Rate and Rhythm: Normal rate and regular rhythm.  Pulmonary:     Effort: Pulmonary effort is normal. No respiratory distress.  Genitourinary:    Comments: Around 4-5cm abscess on the right buttock -- surrounding cellulitis and induration Musculoskeletal:        General: No deformity.  Skin:    General: Skin is warm and dry.  Neurological:     Mental Status: He is alert and oriented to person, place, and time.  Psychiatric:        Mood and Affect: Mood normal.        Behavior: Behavior normal.     ED Results / Procedures / Treatments   Labs (all  labs ordered are listed, but only abnormal results are displayed) Labs Reviewed - No data to display  EKG None  Radiology No results found.  Procedures .Incision and Drainage  Date/Time: 11/19/2023 3:07 PM  Performed by: Rosan Sherlean DEL, PA-C Authorized by: Rosan Sherlean DEL, PA-C   Consent:    Consent obtained:  Verbal   Consent given by:  Patient   Risks, benefits, and alternatives were discussed: yes     Risks discussed:  Bleeding, incomplete drainage, infection, pain and damage to other organs   Alternatives discussed:  No treatment Universal protocol:    Procedure explained and questions answered to patient or proxy's satisfaction: yes     Patient identity confirmed:  Verbally with patient Location:    Type:  Abscess   Size:  4.5cm   Location:  Anogenital   Anogenital location:  Gluteal cleft Pre-procedure details:    Skin preparation:  Povidone-iodine Anesthesia:    Anesthesia method:  Local infiltration   Local anesthetic:  Lidocaine  1% w/o epi Procedure type:    Complexity:  Simple Procedure details:    Incision types:  Single straight   Incision  depth:  Dermal   Wound management:  Probed and deloculated   Drainage:  Bloody and purulent   Drainage amount:  Moderate   Wound treatment:  Wound left open Post-procedure details:    Procedure completion:  Tolerated     Medications Ordered in ED Medications  lidocaine  (PF) (XYLOCAINE ) 1 % injection 10 mL (10 mLs Infiltration Given by Other 11/19/23 1430)    ED Course/ Medical Decision Making/ A&P                                 Medical Decision Making Risk Prescription drug management.   This patient is a 33 y.o. male who presents to the ED for concern of buttock abscess.   Differential diagnoses prior to evaluation: Abscess, cellulitis, sepsis, vs other  Past Medical History / Social History / Additional history: Chart reviewed. Pertinent results include: overall unremarkable  Physical  Exam: Physical exam performed. The pertinent findings include: Around 4-5cm abscess on the right buttock -- surrounding cellulitis and induration  VSS  Medications / Treatment: Abscess drained she does not describe development.  Placed on doxycycline  for antibiosis.  Encouraged sitz baths, and wound care.  Encouraged follow-up with surgery as needed.   Disposition: After consideration of the diagnostic results and the patients response to treatment, I feel that patient with an abscess, cellulitis, will prescribe antibiotics for discharge, ibuprofen , Tylenol  for pain.  emergency department workup does not suggest an emergent condition requiring admission or immediate intervention beyond what has been performed at this time. The plan is: as above. The patient is safe for discharge and has been instructed to return immediately for worsening symptoms, change in symptoms or any other concerns.  Final Clinical Impression(s) / ED Diagnoses Final diagnoses:  Abscess of right buttock    Rx / DC Orders ED Discharge Orders          Ordered    doxycycline  (VIBRAMYCIN ) 100 MG capsule  2 times daily        11/19/23 1501              Jahmeir Geisen, [place] H, PA-C 11/19/23 1509    Zackowski, Scott, MD 11/20/23 [phone]

## 2024-11-01 ENCOUNTER — Other Ambulatory Visit: Payer: Self-pay

## 2024-11-01 ENCOUNTER — Ambulatory Visit (HOSPITAL_COMMUNITY)
Admission: EM | Admit: 2024-11-01 | Discharge: 2024-11-01 | Disposition: A | Discharge status: Home / Self Care | Payer: Self-pay | Attending: Family Medicine | Admitting: Family Medicine

## 2024-11-01 DIAGNOSIS — K0889 Other specified disorders of teeth and supporting structures: Secondary | ICD-10-CM

## 2024-11-01 MED ORDER — AMOXICILLIN-POT CLAVULANATE 875-125 MG PO TABS: 1.0000 | ORAL_TABLET | Freq: Two times a day (BID) | ORAL | 0 refills | Status: AC

## 2024-11-01 MED ORDER — IBUPROFEN 800 MG PO TABS: 800.0000 mg | ORAL_TABLET | Freq: Three times a day (TID) | ORAL | 0 refills | Status: AC

## 2024-11-01 NOTE — ED Triage Notes (Signed)
 PT reports he has a chiped front tooth and a broken rear tooth . Pain at site 9/10. Pain started yesterday.

## 2024-11-01 NOTE — ED Provider Notes (Signed)
 " [facility]   [id number] 11/01/24 Arrival Time: 0807  ASSESSMENT & PLAN:  1. Pain, dental    No sign of abscess requiring I&D at this time.   Meds ordered this encounter  Medications   amoxicillin -clavulanate (AUGMENTIN ) 875-125 MG tablet    Sig: Take 1 tablet by mouth every 12 (twelve) hours.    Dispense:  20 tablet    Refill:  0   ibuprofen  (ADVIL ) 800 MG tablet    Sig: Take 1 tablet (800 mg total) by mouth 3 (three) times daily with meals.    Dispense:  21 tablet    Refill:  0   He plans on making dental f/u. Work and court note given.  Reviewed expectations re: course of current medical issues. Questions answered. Outlined signs and symptoms indicating need for more acute intervention. Patient verbalized understanding. After Visit Summary given.   SUBJECTIVE:  Eric Valencia is a 34 y.o. male who reports R lower gum/tooth pain. PT reports he has a chiped front tooth and a broken rear tooth . Pain at site 9/10. Pain started yesterday. Tolerating PO intake but painful chewing. No tx PTA.   ROS: As per HPI.  OBJECTIVE: Vitals:   11/01/24 0843  BP: (!) 145/106  Pulse: 87  Resp: 20  Temp: 98.8 F (37.1 C)  SpO2: 96%    General appearance: alert; no distress HENT: normocephalic; atraumatic; dentition: poor; right lower gums without areas of fluctuance, drainage, or bleeding and with tenderness to palpation; normal jaw movement without difficulty Neck: supple without LAD; FROM; trachea midline Lungs: normal respirations; unlabored; speaks full sentences without difficulty Skin: warm and dry Psychological: alert and cooperative; normal mood and affect  Allergies[1]  Past Medical History:  Diagnosis Date   Asthma    Hypertension    Social History   Socioeconomic History   Marital status: Single    Spouse name: Not on file   Number of children: Not on file   Years of education: Not on file   Highest education level: Not on file   Occupational History   Not on file  Tobacco Use   Smoking status: Former    Types: Cigarettes   Smokeless tobacco: Current  Vaping Use   Vaping status: Every Day  Substance and Sexual Activity   Alcohol use: No   Drug use: Not Currently    Comment: Pt stated a lot every day   Sexual activity: Yes  Other Topics Concern   Not on file  Social History Narrative   Not on file   Social Drivers of Health   Tobacco Use: High Risk (11/19/2023)   Patient History    Smoking Tobacco Use: Former    Smokeless Tobacco Use: Current    Passive Exposure: Not on [place]uary Strain: Not on file  Food Insecurity: Not on file  Transportation Needs: Not on file  Physical Activity: Not on file  Stress: Not on file  Social Connections: Unknown (02/25/2022)   Received from [facility]   Social Network    Social Network: Not on file  Intimate Partner Violence: Unknown (01/17/2022)   Received from [facility]   HITS    Physically Hurt: Not on file    Insult or Talk Down To: Not on file    Threaten Physical Harm: Not on file    Scream or Curse: Not on file  Depression (EYV7-0): Not on file  Alcohol Screen: Not on file  Housing: Not on file  Utilities: Not on file  Health Literacy: Not on file   No family history on file. Past Surgical History:  Procedure Laterality Date   TONSILLECTOMY         [1] No Known Allergies    Rolinda Rogue, MD 11/01/24 1001  "
